# Patient Record
Sex: Female | Born: 1940 | Race: White | Hispanic: No | Marital: Married | Smoking: Never smoker
Health system: Southern US, Community
[De-identification: ages and names within clinical notes are randomized; demographics above are authoritative.]

## PROBLEM LIST (undated history)

## (undated) DIAGNOSIS — E119 Type 2 diabetes mellitus without complications: Secondary | ICD-10-CM

## (undated) DIAGNOSIS — Z923 Personal history of irradiation: Secondary | ICD-10-CM

## (undated) DIAGNOSIS — R002 Palpitations: Secondary | ICD-10-CM

## (undated) DIAGNOSIS — L97509 Non-pressure chronic ulcer of other part of unspecified foot with unspecified severity: Secondary | ICD-10-CM

## (undated) DIAGNOSIS — G500 Trigeminal neuralgia: Secondary | ICD-10-CM

## (undated) DIAGNOSIS — E041 Nontoxic single thyroid nodule: Secondary | ICD-10-CM

## (undated) DIAGNOSIS — E7800 Pure hypercholesterolemia, unspecified: Secondary | ICD-10-CM

## (undated) HISTORY — DX: Nontoxic single thyroid nodule: E04.1

## (undated) HISTORY — DX: Trigeminal neuralgia: G50.0

## (undated) HISTORY — DX: Palpitations: R00.2

## (undated) HISTORY — DX: Non-pressure chronic ulcer of other part of unspecified foot with unspecified severity: L97.509

---

## 1998-01-31 ENCOUNTER — Inpatient Hospital Stay (HOSPITAL_COMMUNITY): Admission: EM | Admit: 1998-01-31 | Discharge: 1998-02-01 | Payer: Self-pay | Admitting: Emergency Medicine

## 2001-04-07 ENCOUNTER — Other Ambulatory Visit: Admission: RE | Admit: 2001-04-07 | Discharge: 2001-04-07 | Payer: Self-pay | Admitting: Family Medicine

## 2004-04-14 ENCOUNTER — Other Ambulatory Visit: Admission: RE | Admit: 2004-04-14 | Discharge: 2004-04-14 | Payer: Self-pay | Admitting: Family Medicine

## 2005-05-03 ENCOUNTER — Other Ambulatory Visit: Admission: RE | Admit: 2005-05-03 | Discharge: 2005-05-03 | Payer: Self-pay | Admitting: Family Medicine

## 2005-09-26 ENCOUNTER — Encounter: Payer: Self-pay | Admitting: Emergency Medicine

## 2006-07-03 ENCOUNTER — Other Ambulatory Visit: Admission: RE | Admit: 2006-07-03 | Discharge: 2006-07-03 | Payer: Self-pay | Admitting: Family Medicine

## 2007-05-29 DIAGNOSIS — C50919 Malignant neoplasm of unspecified site of unspecified female breast: Secondary | ICD-10-CM

## 2007-05-29 HISTORY — PX: BREAST LUMPECTOMY: SHX2

## 2007-05-29 HISTORY — DX: Malignant neoplasm of unspecified site of unspecified female breast: C50.919

## 2007-11-18 ENCOUNTER — Other Ambulatory Visit: Admission: RE | Admit: 2007-11-18 | Discharge: 2007-11-18 | Payer: Self-pay | Admitting: Family Medicine

## 2008-12-13 ENCOUNTER — Other Ambulatory Visit: Admission: RE | Admit: 2008-12-13 | Discharge: 2008-12-13 | Payer: Self-pay | Admitting: Family Medicine

## 2010-02-06 ENCOUNTER — Other Ambulatory Visit: Admission: RE | Admit: 2010-02-06 | Discharge: 2010-02-06 | Payer: Self-pay | Admitting: Family Medicine

## 2011-02-19 ENCOUNTER — Other Ambulatory Visit: Payer: Self-pay | Admitting: Family Medicine

## 2011-02-19 ENCOUNTER — Other Ambulatory Visit (HOSPITAL_COMMUNITY)
Admission: RE | Admit: 2011-02-19 | Discharge: 2011-02-19 | Disposition: A | Discharge status: Home / Self Care | Payer: Medicare Other | Source: Ambulatory Visit | Attending: Family Medicine | Admitting: Family Medicine

## 2011-02-19 DIAGNOSIS — C50919 Malignant neoplasm of unspecified site of unspecified female breast: Secondary | ICD-10-CM | POA: Insufficient documentation

## 2012-06-02 ENCOUNTER — Other Ambulatory Visit (HOSPITAL_COMMUNITY)
Admission: RE | Admit: 2012-06-02 | Discharge: 2012-06-02 | Disposition: A | Discharge status: Home / Self Care | Payer: Medicare Other | Source: Ambulatory Visit | Attending: Family Medicine | Admitting: Family Medicine

## 2012-06-02 ENCOUNTER — Other Ambulatory Visit: Payer: Self-pay | Admitting: Family Medicine

## 2012-06-02 DIAGNOSIS — Z789 Other specified health status: Secondary | ICD-10-CM | POA: Insufficient documentation

## 2017-06-27 ENCOUNTER — Encounter: Payer: Self-pay | Admitting: Registered"

## 2017-06-27 ENCOUNTER — Encounter: Payer: Medicare Other | Attending: Family Medicine | Admitting: Registered"

## 2017-06-27 DIAGNOSIS — E119 Type 2 diabetes mellitus without complications: Secondary | ICD-10-CM

## 2017-06-27 DIAGNOSIS — E1142 Type 2 diabetes mellitus with diabetic polyneuropathy: Secondary | ICD-10-CM | POA: Diagnosis present

## 2017-06-27 NOTE — Patient Instructions (Signed)
You can include some carbohydrate with your breakfast, but consider having hot tea instead of juice. Think of rice and pasta as a side dish instead of a main entree and be sure to balance with protein. If your morning blood sugar is still high after a few weeks with making some changes, you can try a small bedtime snack. Continue with your regular exercise

## 2017-06-27 NOTE — Progress Notes (Signed)
Diabetes Self-Management Education  Visit Type: First/Initial  Appt. Start Time: 0905 Appt. End Time: 1010  06/27/2017  Ms. Alison Fields, identified by name and date of birth, is a 77 y.o. female with a diagnosis of Diabetes: Type 2.   ASSESSMENT Patient has diabetic peripheral neuropathy which was greatly affecting her sleep. Patient states since starting Lyrica she wakes up 2-3 times per night instead of 6-7 times and is able to go back to sleep.  Patient states both she and her husband have been prediabetic for a long time and since getting the diagnosis in October she has been doing more portion control to prevent weight gain and trying to limit bread to once a day. Patient states she has always been active and prefers to walk outside but has a treadmill in the house when there is inclement weather.  Patient states she works 2 days per week and enjoys staying busy with church activities, house and yard work, and helping out with grandchildren. Patient states her stress has been low since about 12/12/22 after her sister passed away. Prior to her passing, patient states she did have stress for many years trying to manage her sister's care while she was in a nursing home. Patient states they travel in their RV 2-3 months per year.  Diabetes Self-Management Education - 06/27/17 0913      Visit Information   Visit Type  First/Initial      Initial Visit   Diabetes Type  Type 2    Are you currently following a meal plan?  No    Are you taking your medications as prescribed?  Yes    Date Diagnosed  october 2018      Health Coping   How would you rate your overall health?  Good      Psychosocial Assessment   Patient Belief/Attitude about Diabetes  Motivated to manage diabetes    How often do you need to have someone help you when you read instructions, pamphlets, or other written materials from your doctor or pharmacy?  2 - Rarely    What is the last grade level you completed in school?  BA       Complications   Last HgB A1C per patient/outside source  6.4 % per patient, several FBG over 125 for diagnosis    How often do you check your blood sugar?  1-2 times/day    Fasting Blood glucose range (mg/dL)  70-129;130-179 a few times 130-135    Number of hypoglycemic episodes per month  0    Number of hyperglycemic episodes per week  0    Have you had a dilated eye exam in the past 12 months?  Yes    Have you had a dental exam in the past 12 months?  Yes    Are you checking your feet?  No      Dietary Intake   Breakfast  eggs, yogurt, 1/2 bread OR sausage with breakfast, tomato or OJ    Snack (morning)  none    Lunch  hummus graham crackers OR PB OR pimento cheese crackers, diet pepsi OR hamburger 1x week    Snack (afternoon)  none    Dinner  [place] OR pizza OR chili OR church on wednesday evening    Snack (evening)  2 piece of chocolate    Beverage(s)  water, juice, diet pepsi at lunch, 2 margaritas per month      Exercise   Exercise Type  Light (walking / raking  leaves)    How many days per week to you exercise?  5    How many minutes per day do you exercise?  45    Total minutes per week of exercise  225      Patient Education   Previous Diabetes Education  No    Disease state   Definition of diabetes, type 1 and 2, and the diagnosis of diabetes    Nutrition management   Role of diet in the treatment of diabetes and the relationship between the three main macronutrients and blood glucose level    Physical activity and exercise   Role of exercise on diabetes management, blood pressure control and cardiac health.    Monitoring  Identified appropriate SMBG and/or A1C goals.      Individualized Goals (developed by patient)   Nutrition  General guidelines for healthy choices and portions discussed    Physical Activity  Exercise 5-7 days per week      Outcomes   Expected Outcomes  Demonstrated interest in learning. Expect positive outcomes    Future DMSE  PRN    Program  Status  Completed     Individualized Plan for Diabetes Self-Management Training:   Learning Objective:  Patient will have a greater understanding of diabetes self-management. Patient education plan is to attend individual and/or group sessions per assessed needs and concerns.   Patient Instructions  You can include some carbohydrate with your breakfast, but consider having hot tea instead of juice. Think of rice and pasta as a side dish instead of a main entree and be sure to balance with protein. If your morning blood sugar is still high after a few weeks with making some changes, you can try a small bedtime snack. Continue with your regular exercise  Expected Outcomes:  Demonstrated interest in learning. Expect positive outcomes  Education material provided: Living Well with Diabetes, A1C conversion sheet and Snack sheet  If problems or questions, patient to contact team via:  Phone  Future DSME appointment: PRN

## 2018-02-02 ENCOUNTER — Observation Stay (HOSPITAL_COMMUNITY): Payer: Medicare Other

## 2018-02-02 ENCOUNTER — Other Ambulatory Visit: Payer: Self-pay

## 2018-02-02 ENCOUNTER — Observation Stay (HOSPITAL_COMMUNITY)
Admission: EM | Admit: 2018-02-02 | Discharge: 2018-02-03 | Disposition: A | Discharge status: Home / Self Care | Payer: Medicare Other | Attending: Internal Medicine | Admitting: Internal Medicine

## 2018-02-02 ENCOUNTER — Emergency Department (HOSPITAL_COMMUNITY): Payer: Medicare Other

## 2018-02-02 ENCOUNTER — Encounter (HOSPITAL_COMMUNITY): Payer: Self-pay | Admitting: Emergency Medicine

## 2018-02-02 DIAGNOSIS — Z888 Allergy status to other drugs, medicaments and biological substances status: Secondary | ICD-10-CM | POA: Insufficient documentation

## 2018-02-02 DIAGNOSIS — Z882 Allergy status to sulfonamides status: Secondary | ICD-10-CM | POA: Diagnosis not present

## 2018-02-02 DIAGNOSIS — G3189 Other specified degenerative diseases of nervous system: Secondary | ICD-10-CM | POA: Diagnosis not present

## 2018-02-02 DIAGNOSIS — E119 Type 2 diabetes mellitus without complications: Secondary | ICD-10-CM

## 2018-02-02 DIAGNOSIS — M47812 Spondylosis without myelopathy or radiculopathy, cervical region: Secondary | ICD-10-CM | POA: Diagnosis present

## 2018-02-02 DIAGNOSIS — E7800 Pure hypercholesterolemia, unspecified: Secondary | ICD-10-CM | POA: Insufficient documentation

## 2018-02-02 DIAGNOSIS — Z79899 Other long term (current) drug therapy: Secondary | ICD-10-CM | POA: Diagnosis not present

## 2018-02-02 DIAGNOSIS — E042 Nontoxic multinodular goiter: Secondary | ICD-10-CM | POA: Diagnosis not present

## 2018-02-02 DIAGNOSIS — M549 Dorsalgia, unspecified: Secondary | ICD-10-CM

## 2018-02-02 DIAGNOSIS — M50322 Other cervical disc degeneration at C5-C6 level: Secondary | ICD-10-CM | POA: Diagnosis not present

## 2018-02-02 DIAGNOSIS — Z880 Allergy status to penicillin: Secondary | ICD-10-CM | POA: Insufficient documentation

## 2018-02-02 DIAGNOSIS — R55 Syncope and collapse: Secondary | ICD-10-CM | POA: Diagnosis present

## 2018-02-02 DIAGNOSIS — E785 Hyperlipidemia, unspecified: Secondary | ICD-10-CM | POA: Diagnosis not present

## 2018-02-02 DIAGNOSIS — Z881 Allergy status to other antibiotic agents status: Secondary | ICD-10-CM | POA: Diagnosis not present

## 2018-02-02 DIAGNOSIS — M47819 Spondylosis without myelopathy or radiculopathy, site unspecified: Secondary | ICD-10-CM | POA: Diagnosis present

## 2018-02-02 HISTORY — DX: Type 2 diabetes mellitus without complications: E11.9

## 2018-02-02 HISTORY — DX: Pure hypercholesterolemia, unspecified: E78.00

## 2018-02-02 LAB — RAPID URINE DRUG SCREEN, HOSP PERFORMED
AMPHETAMINES: NOT DETECTED
BENZODIAZEPINES: NOT DETECTED
Barbiturates: NOT DETECTED
Cocaine: NOT DETECTED
Opiates: NOT DETECTED
Tetrahydrocannabinol: NOT DETECTED

## 2018-02-02 LAB — GLUCOSE, CAPILLARY
GLUCOSE-CAPILLARY: 119 mg/dL — AB (ref 70–99)
Glucose-Capillary: 86 mg/dL (ref 70–99)

## 2018-02-02 LAB — BASIC METABOLIC PANEL
ANION GAP: 8 (ref 5–15)
BUN: 14 mg/dL (ref 8–23)
CALCIUM: 9.2 mg/dL (ref 8.9–10.3)
CO2: 26 mmol/L (ref 22–32)
CREATININE: 0.72 mg/dL (ref 0.44–1.00)
Chloride: 106 mmol/L (ref 98–111)
Glucose, Bld: 103 mg/dL — ABNORMAL HIGH (ref 70–99)
Potassium: 4.2 mmol/L (ref 3.5–5.1)
SODIUM: 140 mmol/L (ref 135–145)

## 2018-02-02 LAB — CBC
HCT: 43.5 % (ref 36.0–46.0)
HCT: 43.9 % (ref 36.0–46.0)
HEMOGLOBIN: 13.8 g/dL (ref 12.0–15.0)
Hemoglobin: 14.1 g/dL (ref 12.0–15.0)
MCH: 30.3 pg (ref 26.0–34.0)
MCH: 30.1 pg (ref 26.0–34.0)
MCHC: 31.7 g/dL (ref 30.0–36.0)
MCHC: 32.1 g/dL (ref 30.0–36.0)
MCV: 95.4 fL (ref 78.0–100.0)
MCV: 93.8 fL (ref 78.0–100.0)
Platelets: 270 10*3/uL (ref 150–400)
Platelets: 265 10*3/uL (ref 150–400)
RBC: 4.56 MIL/uL (ref 3.87–5.11)
RBC: 4.68 MIL/uL (ref 3.87–5.11)
RDW: 13.4 % (ref 11.5–15.5)
RDW: 13.4 % (ref 11.5–15.5)
WBC: 7.9 10*3/uL (ref 4.0–10.5)
WBC: 8.1 10*3/uL (ref 4.0–10.5)

## 2018-02-02 LAB — URINALYSIS, ROUTINE W REFLEX MICROSCOPIC
Bilirubin Urine: NEGATIVE
Glucose, UA: NEGATIVE mg/dL
Hgb urine dipstick: NEGATIVE
Ketones, ur: NEGATIVE mg/dL
Leukocytes, UA: NEGATIVE
NITRITE: NEGATIVE
Protein, ur: NEGATIVE mg/dL
SPECIFIC GRAVITY, URINE: 1.019 (ref 1.005–1.030)
pH: 6 (ref 5.0–8.0)

## 2018-02-02 LAB — CREATININE, SERUM
CREATININE: 0.67 mg/dL (ref 0.44–1.00)
GFR calc Af Amer: >60 mL/min (ref >60)
GFR calc non Af Amer: >60 mL/min (ref >60)

## 2018-02-02 LAB — CBG MONITORING, ED: GLUCOSE-CAPILLARY: 95 mg/dL (ref 70–99)

## 2018-02-02 LAB — TROPONIN I: Troponin I: <0.03 ng/mL (ref <0.03)

## 2018-02-02 LAB — HEMOGLOBIN A1C
HEMOGLOBIN A1C: 6.3 % — AB (ref 4.8–5.6)
Mean Plasma Glucose: 134.11 mg/dL

## 2018-02-02 LAB — TSH: TSH: 0.947 u[IU]/mL (ref 0.350–4.500)

## 2018-02-02 MED ORDER — METFORMIN HCL ER 500 MG PO TB24
500.0000 mg | ORAL_TABLET | Freq: Every day | ORAL | Status: DC
  Administered 2018-02-03: 500 mg via ORAL
  Filled 2018-02-02: qty 1

## 2018-02-02 MED ORDER — ALUM & MAG HYDROXIDE-SIMETH 200-200-20 MG/5ML PO SUSP: 30.0000 mL | Freq: Four times a day (QID) | ORAL | Status: DC | PRN

## 2018-02-02 MED ORDER — BISACODYL 5 MG PO TBEC: 5.0000 mg | DELAYED_RELEASE_TABLET | Freq: Every day | ORAL | Status: DC | PRN

## 2018-02-02 MED ORDER — ENOXAPARIN SODIUM 40 MG/0.4ML ~~LOC~~ SOLN
40.0000 mg | SUBCUTANEOUS | Status: DC
  Filled 2018-02-02: qty 0.4

## 2018-02-02 MED ORDER — HYDROCODONE-ACETAMINOPHEN 5-325 MG PO TABS: 1.0000 | ORAL_TABLET | ORAL | Status: DC | PRN

## 2018-02-02 MED ORDER — TRAZODONE HCL 50 MG PO TABS
25.0000 mg | ORAL_TABLET | Freq: Every evening | ORAL | Status: DC | PRN
  Administered 2018-02-02: 25 mg via ORAL
  Filled 2018-02-02: qty 1

## 2018-02-02 MED ORDER — OCUVITE-LUTEIN PO CAPS: 1.0000 | ORAL_CAPSULE | Freq: Every day | ORAL | Status: DC

## 2018-02-02 MED ORDER — PROSIGHT PO TABS
1.0000 | ORAL_TABLET | Freq: Every day | ORAL | Status: DC
  Administered 2018-02-02 – 2018-02-03 (×2): 1 via ORAL
  Filled 2018-02-02 (×2): qty 1

## 2018-02-02 MED ORDER — PREGABALIN 25 MG PO CAPS
50.0000 mg | ORAL_CAPSULE | Freq: Three times a day (TID) | ORAL | Status: DC
  Administered 2018-02-02 – 2018-02-03 (×2): 50 mg via ORAL
  Filled 2018-02-02 (×3): qty 2

## 2018-02-02 MED ORDER — ONDANSETRON HCL 4 MG PO TABS: 4.0000 mg | ORAL_TABLET | Freq: Four times a day (QID) | ORAL | Status: DC | PRN

## 2018-02-02 MED ORDER — ATORVASTATIN CALCIUM 20 MG PO TABS
10.0000 mg | ORAL_TABLET | Freq: Every day | ORAL | Status: DC
  Administered 2018-02-02 – 2018-02-03 (×2): 10 mg via ORAL
  Filled 2018-02-02 (×2): qty 1

## 2018-02-02 MED ORDER — OMEGA-3-ACID ETHYL ESTERS 1 G PO CAPS
1.0000 g | ORAL_CAPSULE | Freq: Every day | ORAL | Status: DC
  Administered 2018-02-03: 1 g via ORAL
  Filled 2018-02-02 (×2): qty 1

## 2018-02-02 MED ORDER — ONDANSETRON HCL 4 MG/2ML IJ SOLN: 4.0000 mg | Freq: Four times a day (QID) | INTRAMUSCULAR | Status: DC | PRN

## 2018-02-02 MED ORDER — MULTIVITAMINS PO CAPS: 1.0000 | ORAL_CAPSULE | Freq: Every day | ORAL | Status: DC

## 2018-02-02 MED ORDER — ADULT MULTIVITAMIN W/MINERALS CH
1.0000 | ORAL_TABLET | Freq: Every day | ORAL | Status: DC
  Administered 2018-02-02 – 2018-02-03 (×2): 1 via ORAL
  Filled 2018-02-02 (×2): qty 1

## 2018-02-02 MED ORDER — INSULIN ASPART 100 UNIT/ML ~~LOC~~ SOLN: 0.0000 [IU] | Freq: Every day | SUBCUTANEOUS | Status: DC

## 2018-02-02 MED ORDER — INSULIN ASPART 100 UNIT/ML ~~LOC~~ SOLN: 0.0000 [IU] | Freq: Three times a day (TID) | SUBCUTANEOUS | Status: DC

## 2018-02-02 MED ORDER — CIPROFLOXACIN HCL 500 MG PO TABS
250.0000 mg | ORAL_TABLET | Freq: Two times a day (BID) | ORAL | Status: DC
  Administered 2018-02-02 – 2018-02-03 (×2): 250 mg via ORAL
  Filled 2018-02-02 (×2): qty 1

## 2018-02-02 MED ORDER — SODIUM CHLORIDE 0.9% FLUSH
3.0000 mL | Freq: Two times a day (BID) | INTRAVENOUS | Status: DC
  Administered 2018-02-02 – 2018-02-03 (×2): 3 mL via INTRAVENOUS

## 2018-02-02 NOTE — ED Notes (Signed)
Report called to rn on 5 m 

## 2018-02-02 NOTE — ED Notes (Signed)
Called patient to move to room. Unable to locate patient at this time. 

## 2018-02-02 NOTE — H&P (Signed)
History and Physical    Alison Fields ZDG:[id number] DOB: May 08, 1941 DOA: 02/02/2018  Referring MD/NP/PA:  PCP: Harlan Stains, MD  Outpatient Specialists:  Patient coming from: home  Chief Complaint: dizziness and back pain  HPI: Alison Fields is a 77 y.o. female with medical history significant diabetes and hyperlipidemia presenting with left spell Acute severe upper back and neck pain followed by dizziness with presyncopal episode. She was seen at the urgent care, where she was noted to be " clammy and pale" according to the patient,for which reason she was transferred to [facility] for further evaluation and management.patient's pain is about resolved at this moment he denies any dizziness, chills shortness of breath or palpitations.    ED Course:at the ED patient was hemodynamically stable with negative twelve-lead EKG for acute abnormality. Head CT was negative. Hospitalist consulted for admission for observation  Review of Systems: As per HPI otherwise 10 point review of systems negative.    Past Medical History:  Diagnosis Date  . Diabetes mellitus without complication ([place])   . High cholesterol     History reviewed. No pertinent surgical history.   reports that she has never smoked. She has never used smokeless tobacco. She reports that she drinks alcohol. She reports that she has current or past drug history.  Allergies  Allergen Reactions  . Bactrim [Sulfamethoxazole-Trimethoprim] Rash  . Camphor Hives  . Erythromycin Nausea Only  . Penicillins     No family history on file.  Prior to Admission medications   Medication Sig Start Date End Date Taking? Authorizing Provider  atorvastatin (LIPITOR) 10 MG tablet Take 10 mg by mouth daily.   Yes [provider]  ciprofloxacin (CIPRO) 250 MG tablet Take 250 mg by mouth 2 (two) times daily. Duration 7 days Started 01-28-18 01/29/18  Yes [provider]  Cranberry 405 MG CAPS Take by mouth.   Yes  [provider]  fexofenadine (ALLEGRA) 180 MG tablet Take 180 mg by mouth daily.   Yes [provider]  metFORMIN (GLUCOPHAGE-XR) 500 MG 24 hr tablet Take 500 mg by mouth daily with breakfast.   Yes [provider]  metroNIDAZOLE (METROCREAM) 0.75 % cream Apply topically 2 (two) times daily.   Yes [provider]  Multiple Vitamin (MULTIVITAMINS PO) Take by mouth.   Yes [provider]  multivitamin-lutein (OCUVITE-LUTEIN) CAPS capsule Take 1 capsule by mouth daily.   Yes [provider]  Omega-3 Fatty Acids (FISH OIL) 1000 MG CAPS Take by mouth 2 (two) times daily.   Yes [provider]  pregabalin (LYRICA) 50 MG capsule Take 50 mg by mouth 3 (three) times daily.   Yes [provider]    Physical Exam: Vitals:   02/02/18 1337 02/02/18 1430 02/02/18 1500 02/02/18 1530  BP:  135/73 134/81 120/72  Pulse:  76  70  Resp:  13  13  Temp:      TempSrc:      SpO2:  100%  97%  Weight: 69.4 kg     Height: 5' 6.5" (1.689 m)         Constitutional: NAD, calm, comfortable Vitals:   02/02/18 1337 02/02/18 1430 02/02/18 1500 02/02/18 1530  BP:  135/73 134/81 120/72  Pulse:  76  70  Resp:  13  13  Temp:      TempSrc:      SpO2:  100%  97%  Weight: 69.4 kg     Height: 5' 6.5" (1.689 m)  Eyes: PERRL, lids and conjunctivae normal ENMT: Mucous membranes are moist. Posterior pharynx clear of any exudate or lesions.Normal dentition.  Neck: normal, supple, no masses, no thyromegaly Respiratory: clear to auscultation bilaterally, no wheezing, no crackles. Normal respiratory effort. No accessory muscle use.  Cardiovascular: Regular rate and rhythm, no murmurs / rubs / gallops. No extremity edema. 2+ pedal pulses. No carotid bruits.  Abdomen: no tenderness, no masses palpated. No hepatosplenomegaly. Bowel sounds positive.  Musculoskeletal: no clubbing / cyanosis. No joint deformity upper and lower extremities. Good ROM, no  contractures. Normal muscle tone.  Skin: no rashes, lesions, ulcers. No induration Neurologic: CN 2-12 grossly intact. Sensation intact, DTR normal. Strength 5/5 in all 4.  Psychiatric: Normal judgment and insight. Alert and oriented x 3. Normal mood.    Labs on Admission: I have personally reviewed following labs and imaging studies  CBC: Recent Labs  Lab 02/02/18 1356  WBC 7.9  HGB 13.8  HCT 43.5  MCV 95.4  PLT 858   Basic Metabolic Panel: Recent Labs  Lab 02/02/18 1356  NA 140  K 4.2  CL 106  CO2 26  GLUCOSE 103*  BUN 14  CREATININE 0.72  CALCIUM 9.2   GFR: Estimated Creatinine Clearance: 56.2 mL/min (by C-G formula based on SCr of 0.72 mg/dL). Liver Function Tests: No results for input(s): AST, ALT, ALKPHOS, BILITOT, PROT, ALBUMIN in the last 168 hours. No results for input(s): LIPASE, AMYLASE in the last 168 hours. No results for input(s): AMMONIA in the last 168 hours. Coagulation Profile: No results for input(s): INR, PROTIME in the last 168 hours. Cardiac Enzymes: No results for input(s): CKTOTAL, CKMB, CKMBINDEX, TROPONINI in the last 168 hours. BNP (last 3 results) No results for input(s): PROBNP in the last 8760 hours. HbA1C: No results for input(s): HGBA1C in the last 72 hours. CBG: Recent Labs  Lab 02/02/18 1428  GLUCAP 95   Lipid Profile: No results for input(s): CHOL, HDL, LDLCALC, TRIG, CHOLHDL, LDLDIRECT in the last 72 hours. Thyroid Function Tests: No results for input(s): TSH, T4TOTAL, FREET4, T3FREE, THYROIDAB in the last 72 hours. Anemia Panel: No results for input(s): VITAMINB12, FOLATE, FERRITIN, TIBC, IRON, RETICCTPCT in the last 72 hours. Urine analysis:    Component Value Date/Time   COLORURINE YELLOW 02/02/2018 [place] 02/02/2018 1344   LABSPEC 1.019 02/02/2018 1344   PHURINE 6.0 02/02/2018 1344   GLUCOSEU NEGATIVE 02/02/2018 1344   HGBUR NEGATIVE 02/02/2018 1344   BILIRUBINUR NEGATIVE 02/02/2018 1344    KETONESUR NEGATIVE 02/02/2018 1344   PROTEINUR NEGATIVE 02/02/2018 1344   NITRITE NEGATIVE 02/02/2018 1344   LEUKOCYTESUR NEGATIVE 02/02/2018 1344   Sepsis Labs: @LABRCNTIP (procalcitonin:4,lacticidven:4) )No results found for this or any previous visit (from the past 240 hour(s)).   Radiological Exams on Admission: Ct Head Wo Contrast  Result Date: 02/02/2018 CLINICAL DATA:  Lightheadedness and left arm shaking this morning. EXAM: CT HEAD WITHOUT CONTRAST TECHNIQUE: Contiguous axial images were obtained from the base of the skull through the vertex without intravenous contrast. COMPARISON:  None. FINDINGS: Brain: Minimal enlargement of the ventricles and subarachnoid spaces. No intracranial hemorrhage, mass lesion or CT evidence of acute infarction. Vascular: No hyperdense vessel or unexpected calcification. Skull: Mild bilateral hyperostosis frontalis. Sinuses/Orbits: Status post bilateral cataract extraction. Unremarkable paranasal sinuses. Other: None. IMPRESSION: 1. No acute abnormality. 2. Minimal, age-appropriate diffuse cerebral and cerebellar atrophy. Electronically Signed   By: Claudie Revering M.D.   On: 02/02/2018 15:12    EKG: Independently reviewed.  Assessment/Plan Active Problems:   Near syncope   1. Near syncope: Likely vasovagal reaction to acute spell of upper back and neck pain Telemetry monitoring  2-D echocardiogram (carotid Doppler ordered) Check troponin  2. Acute back/neck pain: Controlled Check x-rays of C-spine and thoracic spine When necessary I dissected  3. Diabetes mellitus:  A1c Sliding-scale insulin Glycemic control      DVT prophylaxis: Lovenox Code Status: Full Family Communication: patient and husband at bedside  Disposition Plan: Anticipate discharge home tomorrow if all is well Consults called:  Admission status:  obs / tele    Benito Mccreedy MD [facility] Pager [phone]  If 7PM-7AM, please contact  night-coverage [email or web address] Password [email or web address]  02/02/2018, 4:30 PM

## 2018-02-02 NOTE — ED Notes (Signed)
Pt has been  Taken to xray unable to transport pt to floor

## 2018-02-02 NOTE — ED Notes (Signed)
Called for pt in lobby, no answer, spouse states she is in restroom

## 2018-02-02 NOTE — Progress Notes (Signed)
Pt arrived from the ED on stretcher. Pt denies any pain, dizziness or light headedness. Pt is alert and oriented x4. She wears glasses and was oriented to the room & staff. Husband at the bedside. 20 G IV in the right AC SL. Pt fall risk- yellow sock and yellow armband placed on, pt educated to call before getting up. Call bell and phone within reach. Will  Continue to monitor.   Paulla Fore, RN .

## 2018-02-02 NOTE — ED Notes (Signed)
ekg ordered again   One was  Done at 1344

## 2018-02-02 NOTE — ED Provider Notes (Signed)
[facility] EMERGENCY DEPARTMENT Provider Note   CSN: [id number] Arrival date & time: 02/02/18  1328     History   Chief Complaint Chief Complaint  Patient presents with  . Dizziness  . Back Pain    HPI Alison Fields is a 77 y.o. female.  HPI Presents after an episode of lightheadedness/near syncope/back pain. Patient was in her usual state of health, when she felt sudden onset near syncope. She was standing up, and at Sunday school, when this occurred, there is both lightheadedness, dizziness, as well as pain in the upper thorax posteriorly. Symptoms have gradually improved spontaneously, though she still does not feel entirely back to normal. She notes that she is a history of diabetes, hypercholesterolemia, but states that she is generally well, active, without recent health issues.  Past Medical History:  Diagnosis Date  . Diabetes mellitus without complication ([place])   . High cholesterol     Patient Active Problem List   Diagnosis Date Noted  . Newly diagnosed diabetes ([place]) 06/27/2017    History reviewed. No pertinent surgical history.   OB History   None      Home Medications    Prior to Admission medications   Medication Sig Start Date End Date Taking? Authorizing Provider  atorvastatin (LIPITOR) 10 MG tablet Take 10 mg by mouth daily.   Yes [provider]  ciprofloxacin (CIPRO) 250 MG tablet Take 250 mg by mouth 2 (two) times daily. Duration 7 days Started 01-28-18 01/29/18  Yes [provider]  Cranberry 405 MG CAPS Take by mouth.   Yes [provider]  fexofenadine (ALLEGRA) 180 MG tablet Take 180 mg by mouth daily.   Yes [provider]  metFORMIN (GLUCOPHAGE-XR) 500 MG 24 hr tablet Take 500 mg by mouth daily with breakfast.   Yes [provider]  metroNIDAZOLE (METROCREAM) 0.75 % cream Apply topically 2 (two) times daily.   Yes [provider]  Multiple Vitamin (MULTIVITAMINS PO)  Take by mouth.   Yes [provider]  multivitamin-lutein (OCUVITE-LUTEIN) CAPS capsule Take 1 capsule by mouth daily.   Yes [provider]  Omega-3 Fatty Acids (FISH OIL) 1000 MG CAPS Take by mouth 2 (two) times daily.   Yes [provider]  pregabalin (LYRICA) 50 MG capsule Take 50 mg by mouth 3 (three) times daily.   Yes [provider]    Family History No family history on file.  Social History Social History   Tobacco Use  . Smoking status: Never Smoker  . Smokeless tobacco: Never Used  Substance Use Topics  . Alcohol use: Yes  . Drug use: Not Currently     Allergies   Bactrim [sulfamethoxazole-trimethoprim]; Camphor; Erythromycin; and Penicillins   Review of Systems Review of Systems  Constitutional:       Per HPI, otherwise negative  HENT:       Per HPI, otherwise negative  Respiratory:       Per HPI, otherwise negative  Cardiovascular:       Per HPI, otherwise negative  Gastrointestinal: Negative for vomiting.  Endocrine:       Negative aside from HPI  Genitourinary:       Neg aside from HPI   Musculoskeletal:       Per HPI, otherwise negative  Skin: Negative.   Neurological: Positive for light-headedness. Negative for syncope.     Physical Exam Updated Vital Signs BP 135/73   Pulse 76   Temp 98.2 F (36.8  C) (Oral)   Resp 13   Ht 5' 6.5" (1.689 m)   Wt 69.4 kg   SpO2 100%   BMI 24.32 kg/m   Physical Exam  Constitutional: She is oriented to person, place, and time. She appears well-developed and well-nourished. No distress.  HENT:  Head: Normocephalic and atraumatic.  Eyes: Conjunctivae and EOM are normal.  Cardiovascular: Normal rate, regular rhythm and intact distal pulses.  Pulmonary/Chest: Effort normal and breath sounds normal. No stridor. No respiratory distress.  Abdominal: She exhibits no distension.  Musculoskeletal: She exhibits no edema.  Neurological: She is alert and oriented to person,  place, and time. No cranial nerve deficit.  Skin: Skin is warm and dry.  Psychiatric: She has a normal mood and affect.  Nursing note and vitals reviewed.    ED Treatments / Results  Labs (all labs ordered are listed, but only abnormal results are displayed) Labs Reviewed  BASIC METABOLIC PANEL - Abnormal; Notable for the following components:      Result Value   Glucose, Bld 103 (*)    All other components within normal limits  CBC  URINALYSIS, ROUTINE W REFLEX MICROSCOPIC  RAPID URINE DRUG SCREEN, HOSP PERFORMED  CBG MONITORING, ED    EKG EKG Interpretation  Date/Time:  Sunday February 02 2018 13:37:30 EDT Ventricular Rate:  81 PR Interval:  134 QRS Duration: 84 QT Interval:  386 QTC Calculation: 448 R Axis:   79 Text Interpretation:  Normal sinus rhythm Nonspecific ST and T wave abnormality Abnormal ECG Abnormal ekg Confirmed by Carmin Muskrat [phone]) on 02/02/2018 2:22:08 PM   Radiology Ct Head Wo Contrast  Result Date: 02/02/2018 CLINICAL DATA:  Lightheadedness and left arm shaking this morning. EXAM: CT HEAD WITHOUT CONTRAST TECHNIQUE: Contiguous axial images were obtained from the base of the skull through the vertex without intravenous contrast. COMPARISON:  None. FINDINGS: Brain: Minimal enlargement of the ventricles and subarachnoid spaces. No intracranial hemorrhage, mass lesion or CT evidence of acute infarction. Vascular: No hyperdense vessel or unexpected calcification. Skull: Mild bilateral hyperostosis frontalis. Sinuses/Orbits: Status post bilateral cataract extraction. Unremarkable paranasal sinuses. Other: None. IMPRESSION: 1. No acute abnormality. 2. Minimal, age-appropriate diffuse cerebral and cerebellar atrophy. Electronically Signed   By: Claudie Revering M.D.   On: 02/02/2018 15:12    Procedures Procedures (including critical care time)  Medications Ordered in ED Medications - No data to display   Initial Impression / Assessment and Plan / ED Course    I have reviewed the triage vital signs and the nursing notes.  Pertinent labs & imaging results that were available during my care of the patient were reviewed by me and considered in my medical decision making (see chart for details).  Initial evaluation I reviewed the patient's documentation from her outpatient visit today, reassuring labs. EKG similar to the one performed here.     3:53 PM Repeat exam the patient is awake and alert.  Not she remains hemodynamic Lee unremarkable. We discussed all findings including unclear etiology for her episode of near syncope, dizziness. Patient has appropriate pulses bilaterally, there is low suspicion for dissection, particularly given the patient's blood pressure value.  CT unremarkable, and given the absence of persistent neurologic deficits, low suspicion for CNS pathology.  With some suspicion for arrhythmia, patient has been on continuous monitoring since ED arrival, will be admitted for further monitoring, management.   Final Clinical Impressions(s) / ED Diagnoses  Near syncope   Carmin Muskrat, MD 02/02/18 1554

## 2018-02-02 NOTE — ED Notes (Signed)
No pain

## 2018-02-02 NOTE — ED Notes (Signed)
CBG obtained (95 mg/dL)

## 2018-02-02 NOTE — ED Triage Notes (Signed)
Pt. Stated, I just left [facility] . I was sitting in Sunday [employer] and started having some pain across my shoulders in the back. I then become light headed and my left arm shaking. My pain in the back is gone its just sore. My head just feels foggy.

## 2018-02-03 ENCOUNTER — Observation Stay (HOSPITAL_BASED_OUTPATIENT_CLINIC_OR_DEPARTMENT_OTHER): Payer: Medicare Other

## 2018-02-03 ENCOUNTER — Encounter (HOSPITAL_COMMUNITY): Payer: Self-pay | Admitting: Internal Medicine

## 2018-02-03 DIAGNOSIS — R55 Syncope and collapse: Secondary | ICD-10-CM

## 2018-02-03 DIAGNOSIS — I5030 Unspecified diastolic (congestive) heart failure: Secondary | ICD-10-CM | POA: Diagnosis not present

## 2018-02-03 LAB — GLUCOSE, CAPILLARY
Glucose-Capillary: 101 mg/dL — ABNORMAL HIGH (ref 70–99)
Glucose-Capillary: 122 mg/dL — ABNORMAL HIGH (ref 70–99)
Glucose-Capillary: 98 mg/dL (ref 70–99)

## 2018-02-03 LAB — ECHOCARDIOGRAM COMPLETE
HEIGHTINCHES: 66.5 in
Weight: 2444.46 oz

## 2018-02-03 LAB — TROPONIN I

## 2018-02-03 MED ORDER — CIPROFLOXACIN HCL 500 MG PO TABS: 250.0000 mg | ORAL_TABLET | Freq: Two times a day (BID) | ORAL | Status: DC

## 2018-02-03 NOTE — Discharge Summary (Signed)
Physician Discharge Summary  Alison Fields ERX:[id number] DOB: [birth year] DOA: 02/02/2018  PCP: Harlan Stains, MD  Admit date: 02/02/2018 Discharge date: 02/04/2018  Admitted From: Home Disposition: Home  Recommendations for Outpatient Follow-up:  1. Follow up with PCP in 3 or 4 days 2. Please obtain thyroid ultrasound due to diagnosis of thyroid nodules on carotid ultrasound.   Home Health: No Equipment/Devices: None  Discharge Condition: Stable CODE STATUS: Full code Diet recommendation: Heart healthy carbohydrate modified  Brief/Interim Summary: Alison Fields is a 77 y.o. female with medical history significant diabetes and hyperlipidemia presenting with left spell Acute severe upper back and neck pain followed by dizziness with presyncopal episode. She was seen at the urgent care, where she was noted to be " clammy and pale" according to the patient,for which reason she was transferred to [facility] for further evaluation and management.patient's pain is about resolved at this moment he denies any dizziness, chills shortness of breath or palpitations.  Patient appears to have a vasovagal reaction due to an acute episode of upper back and neck pain.  2D echocardiogram was obtained and showed grade 1 diastolic dysfunction.  Carotid Doppler was unremarkable in terms of vascular imaging except for 1 to 39% bilateral plaque.  Patient was noted to have multiple thyroid nodules.  She ruled out for myocardial infarction with serial troponins. Cervical spine films revealed C5-6 cervical spondylosis.  Lumbar spine films showed multilevel degeneration, as did thoracic spine with swimmer's views. Patient will follow-up with Dr. Dema Severin for ultrasound of the thyroid.  She might benefit from some physical therapy for spinal arthritis or start with Tylenol.  I did encourage her given the low level of carotid disease to start taking aspirin 1 mg a day but to discuss it with Dr. Dema Severin.  Did have extensive  discussion with the patient regarding her diabetes and an anti-inflammatory diet for diabetes control.  Patient has reached maximal benefit of hospitalization.  Discharge diagnosis, prognosis, plans, follow-up, medications and treatments discussed with the patient(or responsible party) and is in agreement with the plans as described.  Patient is stable for discharge.  Discharge Diagnoses:  Principal Problem:   Near syncope Active Problems:   Newly diagnosed diabetes ([place])   Multiple thyroid nodules   Degenerative arthritis of cervical spine   Spinal arthritis    Discharge Instructions  Discharge Instructions    Diet - low sodium heart healthy   Complete by:  As directed    Diet Carb Modified   Complete by:  As directed    Discharge instructions   Complete by:  As directed    Follow-up with Dr. Dema Severin in 3 or 4 days if symptoms persist recommend further evaluation with outpatient testing.   Increase activity slowly   Complete by:  As directed      Allergies as of 02/03/2018      Reactions   Bactrim [sulfamethoxazole-trimethoprim] Rash   Camphor Hives   Erythromycin Nausea Only   Penicillins       Medication List    TAKE these medications   atorvastatin 10 MG tablet Commonly known as:  LIPITOR Take 10 mg by mouth daily.   ciprofloxacin 250 MG tablet Commonly known as:  CIPRO Take 250 mg by mouth 2 (two) times daily. Duration 7 days Started 01-28-18   Cranberry 405 MG Caps Take by mouth.   fexofenadine 180 MG tablet Commonly known as:  ALLEGRA Take 180 mg by mouth daily.   Fish Oil 1000 MG Caps Take  by mouth 2 (two) times daily.   metFORMIN 500 MG 24 hr tablet Commonly known as:  GLUCOPHAGE-XR Take 500 mg by mouth daily with breakfast.   metroNIDAZOLE 0.75 % cream Commonly known as:  METROCREAM Apply topically 2 (two) times daily.   multivitamin-lutein Caps capsule Take 1 capsule by mouth daily.   MULTIVITAMINS PO Take by mouth.   pregabalin 50 MG  capsule Commonly known as:  LYRICA Take 50 mg by mouth 3 (three) times daily.      Follow-up Information    [facility] .   Specialty:  Vascular Surgery Contact information: [address] [id number] [place] [address] [phone]         Allergies  Allergen Reactions  . Bactrim [Sulfamethoxazole-Trimethoprim] Rash  . Camphor Hives  . Erythromycin Nausea Only  . Penicillins        Procedures/Studies: Dg Cervical Spine 2-3 Views  Result Date: 02/02/2018 CLINICAL DATA:  Acute neck, mid back and LOWER back pain today. Initial encounter. EXAM: CERVICAL SPINE - 2-3 VIEW COMPARISON:  None. FINDINGS: No acute fracture, subluxation or prevertebral soft tissue swelling. Moderate degenerative disc disease/spondylosis at C5-6 noted. Moderate multilevel facet arthropathy identified. No suspicious focal bony lesions identified. IMPRESSION: 1. No evidence of acute abnormality 2. Moderate degenerative disc disease/spondylosis at C5-6 and moderate multilevel facet arthropathy. Electronically Signed   By: Margarette Canada M.D.   On: 02/02/2018 17:12   Dg Thoracic Spine W/swimmers  Result Date: 02/02/2018 CLINICAL DATA:  Acute mid back pain today.  Initial encounter. EXAM: THORACIC SPINE - 3 VIEWS COMPARISON:  None. FINDINGS: No acute fracture or subluxation. No focal bony lesions are identified. Mild multilevel degenerative disc disease/spondylosis noted. IMPRESSION: 1. No acute abnormality 2. Mild multilevel degenerative changes. Electronically Signed   By: Margarette Canada M.D.   On: 02/02/2018 17:15   Dg Lumbar Spine Complete  Result Date: 02/02/2018 CLINICAL DATA:  Acute low back pain today.  Initial encounter. EXAM: LUMBAR SPINE - COMPLETE 4+ VIEW COMPARISON:  None. FINDINGS: Five non rib-bearing lumbar type vertebra are identified in normal alignment. Mild-to-moderate multilevel degenerative disc disease/spondylosis and facet arthropathy  noted, greatest from L3-S1. No focal bony lesions or spondylolysis noted. IMPRESSION: No acute abnormality. Multilevel degenerative changes, greatest from L3-S1. Electronically Signed   By: Margarette Canada M.D.   On: 02/02/2018 17:14   Ct Head Wo Contrast  Result Date: 02/02/2018 CLINICAL DATA:  Lightheadedness and left arm shaking this morning. EXAM: CT HEAD WITHOUT CONTRAST TECHNIQUE: Contiguous axial images were obtained from the base of the skull through the vertex without intravenous contrast. COMPARISON:  None. FINDINGS: Brain: Minimal enlargement of the ventricles and subarachnoid spaces. No intracranial hemorrhage, mass lesion or CT evidence of acute infarction. Vascular: No hyperdense vessel or unexpected calcification. Skull: Mild bilateral hyperostosis frontalis. Sinuses/Orbits: Status post bilateral cataract extraction. Unremarkable paranasal sinuses. Other: None. IMPRESSION: 1. No acute abnormality. 2. Minimal, age-appropriate diffuse cerebral and cerebellar atrophy. Electronically Signed   By: Claudie Revering M.D.   On: 02/02/2018 15:12   Bilateral carotid Dopplers: Multiple thyroid nodules seen at thyroid gland.  Final Interpretation: Right Carotid: Velocities in the right ICA are consistent with a 1-39% stenosis.  Left Carotid: Velocities in the left ICA are consistent with a 1-39% stenosis.  Vertebrals: Bilateral vertebral arteries demonstrate antegrade flow.   Subjective:  Patient feeling better.  No further episodes of pain.   Discharge Exam: Vitals:   02/03/18 0750 02/03/18 1643  BP:  131/63 109/71  Pulse: (!) 59 75  Resp: 18 18  Temp: 98 F (36.7 C) 98.4 F (36.9 C)  SpO2: 96% 96%   Vitals:   02/02/18 2103 02/03/18 0504 02/03/18 0750 02/03/18 1643  BP: 127/79 123/66 131/63 109/71  Pulse: 75 67 (!) 59 75  Resp: 18 18 18 18   Temp: 98.5 F (36.9 C) (!) 97.5 F (36.4 C) 98 F (36.7 C) 98.4 F (36.9 C)  TempSrc: Oral Oral Oral Oral  SpO2: 97% 95% 96% 96%  Weight:  69.3 kg     Height:        General: Pt is alert, awake, not in acute distress Cardiovascular: RRR, S1/S2 +, no rubs, no gallops Respiratory: CTA bilaterally, no wheezing, no rhonchi Abdominal: Soft, NT, ND, bowel sounds + Extremities: no edema, no cyanosis    The results of significant diagnostics from this hospitalization (including imaging, microbiology, ancillary and laboratory) are listed below for reference.     Microbiology: No results found for this or any previous visit (from the past 240 hour(s)).   Labs: BNP (last 3 results) No results for input(s): BNP in the last 8760 hours. Basic Metabolic Panel: Recent Labs  Lab 02/02/18 1356 02/02/18 1721  NA 140  --   K 4.2  --   CL 106  --   CO2 26  --   GLUCOSE 103*  --   BUN 14  --   CREATININE 0.72 0.67  CALCIUM 9.2  --    CBC: Recent Labs  Lab 02/02/18 1356 02/02/18 1721  WBC 7.9 8.1  HGB 13.8 14.1  HCT 43.5 43.9  MCV 95.4 93.8  PLT 270 265   Cardiac Enzymes: Recent Labs  Lab 02/02/18 1721 02/02/18 2300 02/03/18 0720  TROPONINI <0.03 <0.03 <0.03   BNP: Invalid input(s): POCBNP CBG: Recent Labs  Lab 02/02/18 1754 02/02/18 2104 02/03/18 0752 02/03/18 1155 02/03/18 1644  GLUCAP 86 119* 101* 122* 98   D-Dimer No results for input(s): DDIMER in the last 72 hours. Hgb A1c Recent Labs    02/02/18 1721  HGBA1C 6.3*   Lipid Profile No results for input(s): CHOL, HDL, LDLCALC, TRIG, CHOLHDL, LDLDIRECT in the last 72 hours. Thyroid function studies Recent Labs    02/02/18 1721  TSH 0.947   Anemia work up No results for input(s): VITAMINB12, FOLATE, FERRITIN, TIBC, IRON, RETICCTPCT in the last 72 hours. Urinalysis    Component Value Date/Time   COLORURINE YELLOW 02/02/2018 1344   APPEARANCEUR CLEAR 02/02/2018 1344   LABSPEC 1.019 02/02/2018 1344   PHURINE 6.0 02/02/2018 1344   GLUCOSEU NEGATIVE 02/02/2018 1344   HGBUR NEGATIVE 02/02/2018 1344   BILIRUBINUR NEGATIVE 02/02/2018 1344    KETONESUR NEGATIVE 02/02/2018 1344   PROTEINUR NEGATIVE 02/02/2018 1344   NITRITE NEGATIVE 02/02/2018 1344   LEUKOCYTESUR NEGATIVE 02/02/2018 1344   Sepsis Labs Invalid input(s): PROCALCITONIN,  WBC,  LACTICIDVEN Microbiology No results found for this or any previous visit (from the past 240 hour(s)).   Time coordinating discharge: 42 minutes  SIGNED:   Lady Deutscher, MD  FACP [facility] 02/04/2018, 10:27 AM Pager   If 7PM-7AM, please contact night-coverage [email or web address] Password [email or web address]

## 2018-02-03 NOTE — Care Management Obs Status (Signed)
[facility] NOTIFICATION   Patient Details  Name: Lavaya Defreitas MRN: [id number] Date of Birth: [birth year]   Medicare Observation Status Notification Given:  Yes    Bethena Roys, RN 02/03/2018, 4:51 PM

## 2018-02-03 NOTE — Progress Notes (Signed)
Preliminary notes--Bilateral carotid duplex exam completed. Bilateral ICAs 1-39% stenosis. Bilateral vertebral arteries antegrade flow. Incidental finding:  Multiple thyroid nodules seen at bilateral thyroid glands, further evaluation may needed if clinical considers necessary. Alison Fields (RDMS RVT) 02/03/18 3:53 PM

## 2018-02-03 NOTE — Progress Notes (Signed)
  Echocardiogram 2D Echocardiogram has been performed.  Alison Fields 02/03/2018, 9:52 AM

## 2018-02-04 DIAGNOSIS — E042 Nontoxic multinodular goiter: Secondary | ICD-10-CM | POA: Diagnosis present

## 2018-02-04 DIAGNOSIS — M47819 Spondylosis without myelopathy or radiculopathy, site unspecified: Secondary | ICD-10-CM | POA: Diagnosis present

## 2018-02-04 DIAGNOSIS — M47812 Spondylosis without myelopathy or radiculopathy, cervical region: Secondary | ICD-10-CM | POA: Diagnosis present

## 2018-02-07 ENCOUNTER — Other Ambulatory Visit: Payer: Self-pay | Admitting: Family Medicine

## 2018-02-07 DIAGNOSIS — E042 Nontoxic multinodular goiter: Secondary | ICD-10-CM

## 2018-02-13 ENCOUNTER — Ambulatory Visit
Admission: RE | Admit: 2018-02-13 | Discharge: 2018-02-13 | Disposition: A | Discharge status: Home / Self Care | Payer: Medicare Other | Source: Ambulatory Visit | Attending: Family Medicine | Admitting: Family Medicine

## 2018-02-13 DIAGNOSIS — E042 Nontoxic multinodular goiter: Secondary | ICD-10-CM

## 2018-02-25 ENCOUNTER — Other Ambulatory Visit: Payer: Self-pay | Admitting: Family Medicine

## 2018-02-25 DIAGNOSIS — E041 Nontoxic single thyroid nodule: Secondary | ICD-10-CM

## 2018-04-09 ENCOUNTER — Ambulatory Visit
Admission: RE | Admit: 2018-04-09 | Discharge: 2018-04-09 | Disposition: A | Discharge status: Home / Self Care | Payer: Medicare Other | Source: Ambulatory Visit | Attending: Family Medicine | Admitting: Family Medicine

## 2018-04-09 ENCOUNTER — Other Ambulatory Visit (HOSPITAL_COMMUNITY)
Admission: RE | Admit: 2018-04-09 | Discharge: 2018-04-09 | Disposition: A | Discharge status: Home / Self Care | Payer: Medicare Other | Source: Ambulatory Visit | Attending: Radiology | Admitting: Radiology

## 2018-04-09 DIAGNOSIS — E041 Nontoxic single thyroid nodule: Secondary | ICD-10-CM

## 2019-04-01 ENCOUNTER — Other Ambulatory Visit: Payer: Self-pay | Admitting: Family Medicine

## 2019-04-01 DIAGNOSIS — Z1231 Encounter for screening mammogram for malignant neoplasm of breast: Secondary | ICD-10-CM

## 2019-04-27 ENCOUNTER — Ambulatory Visit
Admission: RE | Admit: 2019-04-27 | Discharge: 2019-04-27 | Disposition: A | Discharge status: Home / Self Care | Payer: Medicare Other | Source: Ambulatory Visit | Attending: Family Medicine | Admitting: Family Medicine

## 2019-04-27 ENCOUNTER — Other Ambulatory Visit: Payer: Self-pay | Admitting: Family Medicine

## 2019-04-27 DIAGNOSIS — E041 Nontoxic single thyroid nodule: Secondary | ICD-10-CM

## 2019-05-25 ENCOUNTER — Other Ambulatory Visit: Payer: Self-pay

## 2019-05-25 ENCOUNTER — Ambulatory Visit
Admission: RE | Admit: 2019-05-25 | Discharge: 2019-05-25 | Disposition: A | Discharge status: Home / Self Care | Payer: Medicare Other | Source: Ambulatory Visit | Attending: Family Medicine | Admitting: Family Medicine

## 2019-05-25 DIAGNOSIS — Z1231 Encounter for screening mammogram for malignant neoplasm of breast: Secondary | ICD-10-CM

## 2019-05-25 HISTORY — DX: Personal history of irradiation: Z92.3

## 2019-07-05 ENCOUNTER — Ambulatory Visit: Payer: Medicare Other

## 2019-07-21 ENCOUNTER — Ambulatory Visit: Payer: Medicare Other

## 2020-03-24 ENCOUNTER — Other Ambulatory Visit: Payer: Self-pay | Admitting: Family Medicine

## 2020-03-24 DIAGNOSIS — Z1231 Encounter for screening mammogram for malignant neoplasm of breast: Secondary | ICD-10-CM

## 2020-05-25 ENCOUNTER — Ambulatory Visit
Admission: RE | Admit: 2020-05-25 | Discharge: 2020-05-25 | Disposition: A | Discharge status: Home / Self Care | Payer: Medicare Other | Source: Ambulatory Visit | Attending: Family Medicine | Admitting: Family Medicine

## 2020-05-25 ENCOUNTER — Other Ambulatory Visit: Payer: Self-pay

## 2020-05-25 DIAGNOSIS — Z1231 Encounter for screening mammogram for malignant neoplasm of breast: Secondary | ICD-10-CM

## 2020-05-31 DIAGNOSIS — E1142 Type 2 diabetes mellitus with diabetic polyneuropathy: Secondary | ICD-10-CM | POA: Diagnosis not present

## 2020-05-31 DIAGNOSIS — K219 Gastro-esophageal reflux disease without esophagitis: Secondary | ICD-10-CM | POA: Diagnosis not present

## 2020-05-31 DIAGNOSIS — Z853 Personal history of malignant neoplasm of breast: Secondary | ICD-10-CM | POA: Diagnosis not present

## 2020-06-03 DIAGNOSIS — L84 Corns and callosities: Secondary | ICD-10-CM | POA: Diagnosis not present

## 2020-06-03 DIAGNOSIS — L603 Nail dystrophy: Secondary | ICD-10-CM | POA: Diagnosis not present

## 2020-06-03 DIAGNOSIS — E1142 Type 2 diabetes mellitus with diabetic polyneuropathy: Secondary | ICD-10-CM | POA: Diagnosis not present

## 2020-06-04 DIAGNOSIS — J22 Unspecified acute lower respiratory infection: Secondary | ICD-10-CM | POA: Diagnosis not present

## 2020-06-14 DIAGNOSIS — H9201 Otalgia, right ear: Secondary | ICD-10-CM | POA: Diagnosis not present

## 2020-07-27 DIAGNOSIS — K219 Gastro-esophageal reflux disease without esophagitis: Secondary | ICD-10-CM | POA: Diagnosis not present

## 2020-07-27 DIAGNOSIS — E1142 Type 2 diabetes mellitus with diabetic polyneuropathy: Secondary | ICD-10-CM | POA: Diagnosis not present

## 2020-07-27 DIAGNOSIS — Z853 Personal history of malignant neoplasm of breast: Secondary | ICD-10-CM | POA: Diagnosis not present

## 2020-09-02 DIAGNOSIS — E1142 Type 2 diabetes mellitus with diabetic polyneuropathy: Secondary | ICD-10-CM | POA: Diagnosis not present

## 2020-09-02 DIAGNOSIS — L603 Nail dystrophy: Secondary | ICD-10-CM | POA: Diagnosis not present

## 2020-09-02 DIAGNOSIS — L84 Corns and callosities: Secondary | ICD-10-CM | POA: Diagnosis not present

## 2020-09-14 DIAGNOSIS — K219 Gastro-esophageal reflux disease without esophagitis: Secondary | ICD-10-CM | POA: Diagnosis not present

## 2020-09-14 DIAGNOSIS — Z853 Personal history of malignant neoplasm of breast: Secondary | ICD-10-CM | POA: Diagnosis not present

## 2020-09-14 DIAGNOSIS — E1142 Type 2 diabetes mellitus with diabetic polyneuropathy: Secondary | ICD-10-CM | POA: Diagnosis not present

## 2020-09-26 DIAGNOSIS — E1142 Type 2 diabetes mellitus with diabetic polyneuropathy: Secondary | ICD-10-CM | POA: Diagnosis not present

## 2020-09-26 DIAGNOSIS — Z1159 Encounter for screening for other viral diseases: Secondary | ICD-10-CM | POA: Diagnosis not present

## 2020-09-26 DIAGNOSIS — Z0000 Encounter for general adult medical examination without abnormal findings: Secondary | ICD-10-CM | POA: Diagnosis not present

## 2020-09-26 DIAGNOSIS — Z7984 Long term (current) use of oral hypoglycemic drugs: Secondary | ICD-10-CM | POA: Diagnosis not present

## 2020-09-26 DIAGNOSIS — Z853 Personal history of malignant neoplasm of breast: Secondary | ICD-10-CM | POA: Diagnosis not present

## 2020-09-26 DIAGNOSIS — R7309 Other abnormal glucose: Secondary | ICD-10-CM | POA: Diagnosis not present

## 2020-09-26 DIAGNOSIS — G500 Trigeminal neuralgia: Secondary | ICD-10-CM | POA: Diagnosis not present

## 2020-09-26 DIAGNOSIS — Z136 Encounter for screening for cardiovascular disorders: Secondary | ICD-10-CM | POA: Diagnosis not present

## 2020-09-26 DIAGNOSIS — E041 Nontoxic single thyroid nodule: Secondary | ICD-10-CM | POA: Diagnosis not present

## 2020-09-27 ENCOUNTER — Other Ambulatory Visit: Payer: Self-pay | Admitting: Family Medicine

## 2020-09-27 DIAGNOSIS — E041 Nontoxic single thyroid nodule: Secondary | ICD-10-CM

## 2020-10-04 ENCOUNTER — Ambulatory Visit
Admission: RE | Admit: 2020-10-04 | Discharge: 2020-10-04 | Disposition: A | Discharge status: Home / Self Care | Payer: Medicare Other | Source: Ambulatory Visit | Attending: Family Medicine | Admitting: Family Medicine

## 2020-10-04 DIAGNOSIS — E041 Nontoxic single thyroid nodule: Secondary | ICD-10-CM | POA: Diagnosis not present

## 2020-12-06 DIAGNOSIS — L84 Corns and callosities: Secondary | ICD-10-CM | POA: Diagnosis not present

## 2020-12-06 DIAGNOSIS — E1142 Type 2 diabetes mellitus with diabetic polyneuropathy: Secondary | ICD-10-CM | POA: Diagnosis not present

## 2020-12-06 DIAGNOSIS — L603 Nail dystrophy: Secondary | ICD-10-CM | POA: Diagnosis not present

## 2020-12-06 DIAGNOSIS — H524 Presbyopia: Secondary | ICD-10-CM | POA: Diagnosis not present

## 2020-12-06 DIAGNOSIS — E119 Type 2 diabetes mellitus without complications: Secondary | ICD-10-CM | POA: Diagnosis not present

## 2020-12-06 DIAGNOSIS — M76812 Anterior tibial syndrome, left leg: Secondary | ICD-10-CM | POA: Diagnosis not present

## 2020-12-13 DIAGNOSIS — M76812 Anterior tibial syndrome, left leg: Secondary | ICD-10-CM | POA: Diagnosis not present

## 2020-12-15 DIAGNOSIS — L821 Other seborrheic keratosis: Secondary | ICD-10-CM | POA: Diagnosis not present

## 2020-12-22 DIAGNOSIS — E1142 Type 2 diabetes mellitus with diabetic polyneuropathy: Secondary | ICD-10-CM | POA: Diagnosis not present

## 2020-12-22 DIAGNOSIS — K219 Gastro-esophageal reflux disease without esophagitis: Secondary | ICD-10-CM | POA: Diagnosis not present

## 2020-12-22 DIAGNOSIS — Z853 Personal history of malignant neoplasm of breast: Secondary | ICD-10-CM | POA: Diagnosis not present

## 2021-03-07 DIAGNOSIS — I70203 Unspecified atherosclerosis of native arteries of extremities, bilateral legs: Secondary | ICD-10-CM | POA: Diagnosis not present

## 2021-03-07 DIAGNOSIS — L84 Corns and callosities: Secondary | ICD-10-CM | POA: Diagnosis not present

## 2021-03-07 DIAGNOSIS — E1151 Type 2 diabetes mellitus with diabetic peripheral angiopathy without gangrene: Secondary | ICD-10-CM | POA: Diagnosis not present

## 2021-03-07 DIAGNOSIS — I739 Peripheral vascular disease, unspecified: Secondary | ICD-10-CM | POA: Diagnosis not present

## 2021-03-07 DIAGNOSIS — L603 Nail dystrophy: Secondary | ICD-10-CM | POA: Diagnosis not present

## 2021-03-09 DIAGNOSIS — E1142 Type 2 diabetes mellitus with diabetic polyneuropathy: Secondary | ICD-10-CM | POA: Diagnosis not present

## 2021-03-09 DIAGNOSIS — Z853 Personal history of malignant neoplasm of breast: Secondary | ICD-10-CM | POA: Diagnosis not present

## 2021-03-09 DIAGNOSIS — K219 Gastro-esophageal reflux disease without esophagitis: Secondary | ICD-10-CM | POA: Diagnosis not present

## 2021-03-28 DIAGNOSIS — U071 COVID-19: Secondary | ICD-10-CM | POA: Diagnosis not present

## 2021-04-06 DIAGNOSIS — U071 COVID-19: Secondary | ICD-10-CM | POA: Diagnosis not present

## 2021-04-14 ENCOUNTER — Other Ambulatory Visit: Payer: Self-pay | Admitting: Family Medicine

## 2021-04-14 DIAGNOSIS — Z1231 Encounter for screening mammogram for malignant neoplasm of breast: Secondary | ICD-10-CM

## 2021-04-18 DIAGNOSIS — E1151 Type 2 diabetes mellitus with diabetic peripheral angiopathy without gangrene: Secondary | ICD-10-CM | POA: Diagnosis not present

## 2021-05-24 DIAGNOSIS — G500 Trigeminal neuralgia: Secondary | ICD-10-CM | POA: Diagnosis not present

## 2021-05-24 DIAGNOSIS — E1142 Type 2 diabetes mellitus with diabetic polyneuropathy: Secondary | ICD-10-CM | POA: Diagnosis not present

## 2021-05-26 ENCOUNTER — Ambulatory Visit
Admission: RE | Admit: 2021-05-26 | Discharge: 2021-05-26 | Disposition: A | Discharge status: Home / Self Care | Payer: Medicare Other | Source: Ambulatory Visit | Attending: Family Medicine | Admitting: Family Medicine

## 2021-05-26 DIAGNOSIS — Z1231 Encounter for screening mammogram for malignant neoplasm of breast: Secondary | ICD-10-CM

## 2021-06-05 DIAGNOSIS — K219 Gastro-esophageal reflux disease without esophagitis: Secondary | ICD-10-CM | POA: Diagnosis not present

## 2021-06-05 DIAGNOSIS — E1142 Type 2 diabetes mellitus with diabetic polyneuropathy: Secondary | ICD-10-CM | POA: Diagnosis not present

## 2021-10-02 ENCOUNTER — Emergency Department (HOSPITAL_BASED_OUTPATIENT_CLINIC_OR_DEPARTMENT_OTHER)
Admission: EM | Admit: 2021-10-02 | Discharge: 2021-10-02 | Disposition: A | Discharge status: Home / Self Care | Payer: Medicare Other | Attending: Emergency Medicine | Admitting: Emergency Medicine

## 2021-10-02 ENCOUNTER — Other Ambulatory Visit: Payer: Self-pay

## 2021-10-02 ENCOUNTER — Emergency Department (HOSPITAL_BASED_OUTPATIENT_CLINIC_OR_DEPARTMENT_OTHER): Payer: Medicare Other

## 2021-10-02 ENCOUNTER — Encounter (HOSPITAL_BASED_OUTPATIENT_CLINIC_OR_DEPARTMENT_OTHER): Payer: Self-pay | Admitting: Obstetrics and Gynecology

## 2021-10-02 DIAGNOSIS — M542 Cervicalgia: Secondary | ICD-10-CM | POA: Insufficient documentation

## 2021-10-02 DIAGNOSIS — Z79899 Other long term (current) drug therapy: Secondary | ICD-10-CM | POA: Diagnosis not present

## 2021-10-02 DIAGNOSIS — R531 Weakness: Secondary | ICD-10-CM | POA: Diagnosis not present

## 2021-10-02 DIAGNOSIS — R519 Headache, unspecified: Secondary | ICD-10-CM | POA: Insufficient documentation

## 2021-10-02 DIAGNOSIS — E119 Type 2 diabetes mellitus without complications: Secondary | ICD-10-CM | POA: Diagnosis not present

## 2021-10-02 DIAGNOSIS — Z7984 Long term (current) use of oral hypoglycemic drugs: Secondary | ICD-10-CM | POA: Diagnosis not present

## 2021-10-02 DIAGNOSIS — Z853 Personal history of malignant neoplasm of breast: Secondary | ICD-10-CM | POA: Insufficient documentation

## 2021-10-02 DIAGNOSIS — G4486 Cervicogenic headache: Secondary | ICD-10-CM | POA: Diagnosis not present

## 2021-10-02 LAB — BASIC METABOLIC PANEL
Anion gap: 11 (ref 5–15)
BUN: 18 mg/dL (ref 8–23)
CO2: 27 mmol/L (ref 22–32)
Calcium: 10.5 mg/dL — ABNORMAL HIGH (ref 8.9–10.3)
Chloride: 99 mmol/L (ref 98–111)
Creatinine, Ser: 0.62 mg/dL (ref 0.44–1.00)
GFR, Estimated: >60 mL/min (ref >60)
Glucose, Bld: 102 mg/dL — ABNORMAL HIGH (ref 70–99)
Potassium: 4 mmol/L (ref 3.5–5.1)
Sodium: 137 mmol/L (ref 135–145)

## 2021-10-02 LAB — CBC WITH DIFFERENTIAL/PLATELET
Abs Immature Granulocytes: 0.01 10*3/uL (ref 0.00–0.07)
Basophils Absolute: 0.1 10*3/uL (ref 0.0–0.1)
Basophils Relative: 1 %
Eosinophils Absolute: 0 10*3/uL (ref 0.0–0.5)
Eosinophils Relative: 0 %
HCT: 44 % (ref 36.0–46.0)
Hemoglobin: 14.2 g/dL (ref 12.0–15.0)
Immature Granulocytes: 0 %
Lymphocytes Relative: 21 %
Lymphs Abs: 1.5 10*3/uL (ref 0.7–4.0)
MCH: 29.8 pg (ref 26.0–34.0)
MCHC: 32.3 g/dL (ref 30.0–36.0)
MCV: 92.2 fL (ref 80.0–100.0)
Monocytes Absolute: 0.7 10*3/uL (ref 0.1–1.0)
Monocytes Relative: 10 %
Neutro Abs: 4.7 10*3/uL (ref 1.7–7.7)
Neutrophils Relative %: 68 %
Platelets: 283 10*3/uL (ref 150–400)
RBC: 4.77 MIL/uL (ref 3.87–5.11)
RDW: 13.2 % (ref 11.5–15.5)
WBC: 6.9 10*3/uL (ref 4.0–10.5)
nRBC: 0 % (ref 0.0–0.2)

## 2021-10-02 MED ORDER — METHOCARBAMOL 500 MG PO TABS: 500.0000 mg | ORAL_TABLET | Freq: Two times a day (BID) | ORAL | 0 refills | Status: DC

## 2021-10-02 MED ORDER — IOHEXOL 350 MG/ML SOLN
75.0000 mL | Freq: Once | INTRAVENOUS | Status: AC | PRN
  Administered 2021-10-02: 100 mL via INTRAVENOUS

## 2021-10-02 NOTE — ED Notes (Signed)
RN provided AVS using Teachback Method. Patient verbalizes understanding of Discharge Instructions. Opportunity for Questioning and Answers were provided by RN. Patient Discharged from ED ambulatory to Home with Family. ? ?

## 2021-10-02 NOTE — Discharge Instructions (Signed)
Contact a health care provider if: ?You have headaches that are getting worse and happening more often. ?You have headaches with any of the following: ?Fever. ?Numbness. ?Weakness. ?Dizziness. ?Nausea or vomiting. ?

## 2021-10-02 NOTE — ED Notes (Signed)
Patient transported to radiology at this Time. ?

## 2021-10-02 NOTE — ED Notes (Signed)
PA at the Bedside. ?

## 2021-10-02 NOTE — ED Provider Notes (Signed)
[facility] DE[facility] Provider Note   CSN: [id number] Arrival date & time: 10/02/21  1536     History  Chief Complaint  Patient presents with   Headache    Alison Fields is a 81 y.o. female who presents emergency department with chief complaint of throbbing in her occipital region.  Patient is noted to have a past medical history of degenerative arthritis of the cervical spine, high cholesterol, history of breast cancer with radiation therapy, history of thyroid nodules and diabetes.  Patient is normally very active walking 2 miles a day with her husband.  Today she states that around 4:00 in the morning she was woke from sleep with extremely painful throbbing sensation over the lateral right occipital region.  She states that since that time she has felt a little off balance and has been feeling really rundown today.  She states is very unusual for her.  She went to an urgent care but was sent here for further evaluation.  She denies changes in vision, she has no current pain at this time she denies nausea, vomiting, room spinning dizziness, unilateral weakness, changes in her hearing.  The history is provided by the patient. No language interpreter was used.  Headache     Home Medications Prior to Admission medications   Medication Sig Start Date End Date Taking? Authorizing Provider  atorvastatin (LIPITOR) 10 MG tablet Take 10 mg by mouth daily.    [provider]  ciprofloxacin (CIPRO) 250 MG tablet Take 250 mg by mouth 2 (two) times daily. Duration 7 days Started 01-28-18 01/29/18   [provider]  Cranberry 405 MG CAPS Take by mouth.    [provider]  fexofenadine (ALLEGRA) 180 MG tablet Take 180 mg by mouth daily.    [provider]  metFORMIN (GLUCOPHAGE-XR) 500 MG 24 hr tablet Take 500 mg by mouth daily with breakfast.    [provider]  metroNIDAZOLE (METROCREAM) 0.75 % cream Apply topically 2 (two) times daily.     [provider]  Multiple Vitamin (MULTIVITAMINS PO) Take by mouth.    [provider]  multivitamin-lutein (OCUVITE-LUTEIN) CAPS capsule Take 1 capsule by mouth daily.    [provider]  Omega-3 Fatty Acids (FISH OIL) 1000 MG CAPS Take by mouth 2 (two) times daily.    [provider]  pregabalin (LYRICA) 50 MG capsule Take 50 mg by mouth 3 (three) times daily.    [provider]      Allergies    Bactrim [sulfamethoxazole-trimethoprim], Camphor, Erythromycin, and Penicillins    Review of Systems   Review of Systems  Neurological:  Positive for headaches.   Physical Exam Updated Vital Signs BP (!) 151/82 (BP Location: Right Arm)   Pulse 85   Temp 98.8 F (37.1 C)   Resp 14   Ht 5' 6.5" (1.689 m)   Wt 68.9 kg   SpO2 100%   BMI 24.15 kg/m  Physical Exam Vitals and nursing note reviewed.  Constitutional:      General: She is not in acute distress.    Appearance: She is well-developed. She is not diaphoretic.  HENT:     Head: Normocephalic and atraumatic.     Right Ear: External ear normal.     Left Ear: External ear normal.     Nose: Nose normal.     Mouth/Throat:     Mouth: Mucous membranes are moist.  Eyes:     General: No visual field deficit or  scleral icterus.    Extraocular Movements: Extraocular movements intact.     Conjunctiva/sclera: Conjunctivae normal.     Pupils: Pupils are equal, round, and reactive to light.  Cardiovascular:     Rate and Rhythm: Normal rate and regular rhythm.     Heart sounds: Normal heart sounds. No murmur heard.   No friction rub. No gallop.  Pulmonary:     Effort: Pulmonary effort is normal. No respiratory distress.     Breath sounds: Normal breath sounds.  Abdominal:     General: Bowel sounds are normal. There is no distension.     Palpations: Abdomen is soft. There is no mass.     Tenderness: There is no abdominal tenderness. There is no guarding.  Musculoskeletal:     Cervical  back: Normal range of motion.  Skin:    General: Skin is warm and dry.  Neurological:     Mental Status: She is alert and oriented to person, place, and time.     GCS: GCS eye subscore is 4. GCS verbal subscore is 5. GCS motor subscore is 6.     Cranial Nerves: No cranial nerve deficit, dysarthria or facial asymmetry.     Sensory: No sensory deficit.     Motor: No weakness.     Coordination: Romberg sign negative. Coordination normal.     Gait: Gait normal.     Deep Tendon Reflexes: Reflexes normal.     Comments: Patient had some difficulty with tandem walking.  The remainder of her balance and coordination testing was normal.  No nystagmus  Psychiatric:        Behavior: Behavior normal.    ED Results / Procedures / Treatments   Labs (all labs ordered are listed, but only abnormal results are displayed) Labs Reviewed  BASIC METABOLIC PANEL  CBC WITH DIFFERENTIAL/PLATELET    EKG None  Radiology No results found.  Procedures Procedures    Medications Ordered in ED Medications - No data to display  ED Course/ Medical Decision Making/ A&P Clinical Course as of 10/02/21 1953  Mon Oct 02, 2021  1607 Basic metabolic panel(!) [AH]  3710 CBC with Differential [AH]  1953 CT ANGIO HEAD NECK W WO CM I visualized CT angiogram of the head and neck which shows no acute findings.  Agree with radiologic interpretation [AH]    Clinical Course User Index [AH] Margarita Mail, PA-C                           Medical Decision Making 81 year old female who presents emergency department with a chief complaint of neck pain and occipital pain on the right along with some balance issues this morning presents for evaluation.  Her symptoms have resolved prior to arrival but she is feeling more weak and more drained.  Differential diagnosis includes occipital neuralgia, cervicogenic headache, muscle spasm, vertebral artery dissection.  Her neurologic examination was otherwise reassuring.  CT  angiogram of the head and neck showed no vaso-occlusive disease or dissections.  Patient will be discharged with Robaxin and Tylenol should this recur.  She is to follow-up with her PCP this coming Friday in 1 week.  Discussed outpatient follow-up and return precautions.  Problems Addressed: Occipital pain: acute illness or injury  Amount and/or Complexity of Data Reviewed Independent Historian: spouse Labs: ordered. Decision-making details documented in ED Course. Radiology: ordered and independent interpretation performed. Decision-making details documented in ED Course.  Risk Prescription drug management.  Final Clinical Impression(s) / ED Diagnoses Final diagnoses:  None    Rx / DC Orders ED Discharge Orders     None         Margarita Mail, PA-C 10/02/21 1953    Luna Fuse, MD 10/13/21 2236

## 2021-10-02 NOTE — ED Triage Notes (Signed)
Patient reports to the ER for a throbbing pain in the occipital area of her head. Patient reports it woke her from sleep. Patient reports she felt unsteady and could not seem to get the pain under control. She reports she was able to finally go back to sleep. Patient reports the back of her head is sensitive and she feels weaker than normal.  ?

## 2021-10-06 DIAGNOSIS — Z79899 Other long term (current) drug therapy: Secondary | ICD-10-CM | POA: Diagnosis not present

## 2021-10-06 DIAGNOSIS — Z853 Personal history of malignant neoplasm of breast: Secondary | ICD-10-CM | POA: Diagnosis not present

## 2021-10-06 DIAGNOSIS — K219 Gastro-esophageal reflux disease without esophagitis: Secondary | ICD-10-CM | POA: Diagnosis not present

## 2021-10-06 DIAGNOSIS — Z0000 Encounter for general adult medical examination without abnormal findings: Secondary | ICD-10-CM | POA: Diagnosis not present

## 2021-10-06 DIAGNOSIS — R519 Headache, unspecified: Secondary | ICD-10-CM | POA: Diagnosis not present

## 2021-10-06 DIAGNOSIS — E1142 Type 2 diabetes mellitus with diabetic polyneuropathy: Secondary | ICD-10-CM | POA: Diagnosis not present

## 2021-10-06 DIAGNOSIS — G500 Trigeminal neuralgia: Secondary | ICD-10-CM | POA: Diagnosis not present

## 2021-10-06 DIAGNOSIS — E041 Nontoxic single thyroid nodule: Secondary | ICD-10-CM | POA: Diagnosis not present

## 2021-10-16 ENCOUNTER — Other Ambulatory Visit: Payer: Self-pay | Admitting: Family Medicine

## 2021-10-16 DIAGNOSIS — E2839 Other primary ovarian failure: Secondary | ICD-10-CM

## 2021-10-24 ENCOUNTER — Ambulatory Visit
Admission: RE | Admit: 2021-10-24 | Discharge: 2021-10-24 | Disposition: A | Discharge status: Home / Self Care | Payer: Medicare Other | Source: Ambulatory Visit | Attending: Family Medicine | Admitting: Family Medicine

## 2021-10-24 DIAGNOSIS — E2839 Other primary ovarian failure: Secondary | ICD-10-CM

## 2021-10-24 DIAGNOSIS — M85832 Other specified disorders of bone density and structure, left forearm: Secondary | ICD-10-CM | POA: Diagnosis not present

## 2021-10-24 DIAGNOSIS — Z780 Asymptomatic menopausal state: Secondary | ICD-10-CM | POA: Diagnosis not present

## 2021-12-05 DIAGNOSIS — K219 Gastro-esophageal reflux disease without esophagitis: Secondary | ICD-10-CM | POA: Diagnosis not present

## 2021-12-05 DIAGNOSIS — E1142 Type 2 diabetes mellitus with diabetic polyneuropathy: Secondary | ICD-10-CM | POA: Diagnosis not present

## 2021-12-05 DIAGNOSIS — E785 Hyperlipidemia, unspecified: Secondary | ICD-10-CM | POA: Diagnosis not present

## 2021-12-08 DIAGNOSIS — Z961 Presence of intraocular lens: Secondary | ICD-10-CM | POA: Diagnosis not present

## 2021-12-08 DIAGNOSIS — H5203 Hypermetropia, bilateral: Secondary | ICD-10-CM | POA: Diagnosis not present

## 2021-12-08 DIAGNOSIS — E119 Type 2 diabetes mellitus without complications: Secondary | ICD-10-CM | POA: Diagnosis not present

## 2022-03-08 DIAGNOSIS — H5711 Ocular pain, right eye: Secondary | ICD-10-CM | POA: Diagnosis not present

## 2022-03-08 DIAGNOSIS — H18831 Recurrent erosion of cornea, right eye: Secondary | ICD-10-CM | POA: Diagnosis not present

## 2022-03-09 DIAGNOSIS — S0502XD Injury of conjunctiva and corneal abrasion without foreign body, left eye, subsequent encounter: Secondary | ICD-10-CM | POA: Diagnosis not present

## 2022-03-09 DIAGNOSIS — H1820 Unspecified corneal edema: Secondary | ICD-10-CM | POA: Diagnosis not present

## 2022-04-01 DIAGNOSIS — G5139 Clonic hemifacial spasm, unspecified: Secondary | ICD-10-CM | POA: Diagnosis not present

## 2022-04-09 ENCOUNTER — Other Ambulatory Visit: Payer: Self-pay

## 2022-04-09 ENCOUNTER — Emergency Department (HOSPITAL_BASED_OUTPATIENT_CLINIC_OR_DEPARTMENT_OTHER)
Admission: EM | Admit: 2022-04-09 | Discharge: 2022-04-09 | Disposition: A | Discharge status: Home / Self Care | Payer: Medicare Other | Attending: Emergency Medicine | Admitting: Emergency Medicine

## 2022-04-09 ENCOUNTER — Emergency Department (HOSPITAL_BASED_OUTPATIENT_CLINIC_OR_DEPARTMENT_OTHER): Payer: Medicare Other | Admitting: Radiology

## 2022-04-09 ENCOUNTER — Encounter (HOSPITAL_BASED_OUTPATIENT_CLINIC_OR_DEPARTMENT_OTHER): Payer: Self-pay | Admitting: Emergency Medicine

## 2022-04-09 DIAGNOSIS — R000 Tachycardia, unspecified: Secondary | ICD-10-CM | POA: Diagnosis not present

## 2022-04-09 DIAGNOSIS — Z853 Personal history of malignant neoplasm of breast: Secondary | ICD-10-CM | POA: Diagnosis not present

## 2022-04-09 DIAGNOSIS — R079 Chest pain, unspecified: Secondary | ICD-10-CM | POA: Diagnosis not present

## 2022-04-09 DIAGNOSIS — E119 Type 2 diabetes mellitus without complications: Secondary | ICD-10-CM | POA: Insufficient documentation

## 2022-04-09 DIAGNOSIS — Z7984 Long term (current) use of oral hypoglycemic drugs: Secondary | ICD-10-CM | POA: Diagnosis not present

## 2022-04-09 DIAGNOSIS — X500XXA Overexertion from strenuous movement or load, initial encounter: Secondary | ICD-10-CM | POA: Insufficient documentation

## 2022-04-09 DIAGNOSIS — X509XXA Other and unspecified overexertion or strenuous movements or postures, initial encounter: Secondary | ICD-10-CM

## 2022-04-09 LAB — BASIC METABOLIC PANEL
Anion gap: 13 (ref 5–15)
BUN: 15 mg/dL (ref 8–23)
CO2: 24 mmol/L (ref 22–32)
Calcium: 10 mg/dL (ref 8.9–10.3)
Chloride: 100 mmol/L (ref 98–111)
Creatinine, Ser: 0.61 mg/dL (ref 0.44–1.00)
GFR, Estimated: >60 mL/min (ref >60)
Glucose, Bld: 115 mg/dL — ABNORMAL HIGH (ref 70–99)
Potassium: 4 mmol/L (ref 3.5–5.1)
Sodium: 137 mmol/L (ref 135–145)

## 2022-04-09 LAB — CBC
HCT: 43.2 % (ref 36.0–46.0)
Hemoglobin: 13.9 g/dL (ref 12.0–15.0)
MCH: 29.8 pg (ref 26.0–34.0)
MCHC: 32.2 g/dL (ref 30.0–36.0)
MCV: 92.7 fL (ref 80.0–100.0)
Platelets: 265 10*3/uL (ref 150–400)
RBC: 4.66 MIL/uL (ref 3.87–5.11)
RDW: 13.5 % (ref 11.5–15.5)
WBC: 6.8 10*3/uL (ref 4.0–10.5)
nRBC: 0 % (ref 0.0–0.2)

## 2022-04-09 LAB — TROPONIN I (HIGH SENSITIVITY)
Troponin I (High Sensitivity): 2 ng/L (ref <18)
Troponin I (High Sensitivity): 3 ng/L (ref <18)

## 2022-04-09 NOTE — ED Triage Notes (Signed)
Pt arrives to ED with c/o tachycardia. She reports she was doing housework today when she suddenly felt "off", "doesn't feel right", and her heart race. She notes that her pulse at home was in the 130's. Was dx w/ trigeminal neuralgia last week, was given lyrica for x5 days.

## 2022-04-09 NOTE — ED Provider Notes (Signed)
[facility]ERGENC[facility] DEPT Provider Note   CSN: [id number] Arrival date & time: 04/09/22  1352     History  Chief Complaint  Patient presents with   Tachycardia    Alison Fields is a 81 y.o. female with medical history of breast cancer, diabetes, high cholesterol.  Patient presents to ED for evaluation of tachycardia.  Patient reports that today she was doing house chores to include pushing and pulling boxes, cleaning for about an hour and a half.  Patient reports that towards the end of her chores, she noted that she felt "off".  Patient is unable to discern what this means, just describes that she felt "off" and weak.  Patient notes that at this time she put a pulse ox on and was noted to have a heart rate of 130 bpm.  Patient denies any chest pain or shortness of breath during this time.  The patient reports that at this time, she sat down and her and her husband decided to present to the ED for evaluation.  On examination, the patient denies any chest pain, shortness of breath, nausea, vomiting, diarrhea, abdominal pain, back pain, dysuria, lightheadedness, dizziness, syncopal events.  Patient denies history of cardiac events.  Patient reports that last week she was diagnosed with trigeminal neuralgia and placed on Lyrica for 5 days.  HPI     Home Medications Prior to Admission medications   Medication Sig Start Date End Date Taking? Authorizing Provider  atorvastatin (LIPITOR) 10 MG tablet Take 10 mg by mouth daily.    [provider]  ciprofloxacin (CIPRO) 250 MG tablet Take 250 mg by mouth 2 (two) times daily. Duration 7 days Started 01-28-18 01/29/18   [provider]  Cranberry 405 MG CAPS Take by mouth.    [provider]  fexofenadine (ALLEGRA) 180 MG tablet Take 180 mg by mouth daily.    [provider]  metFORMIN (GLUCOPHAGE-XR) 500 MG 24 hr tablet Take 500 mg by mouth daily with breakfast.    [provider]   methocarbamol (ROBAXIN) 500 MG tablet Take 1 tablet (500 mg total) by mouth 2 (two) times daily. 10/02/21   Harris, Abigail, PA-C  metroNIDAZOLE (METROCREAM) 0.75 % cream Apply topically 2 (two) times daily.    [provider]  Multiple Vitamin (MULTIVITAMINS PO) Take by mouth.    [provider]  multivitamin-lutein (OCUVITE-LUTEIN) CAPS capsule Take 1 capsule by mouth daily.    [provider]  Omega-3 Fatty Acids (FISH OIL) 1000 MG CAPS Take by mouth 2 (two) times daily.    [provider]  pregabalin (LYRICA) 50 MG capsule Take 50 mg by mouth 3 (three) times daily.    [provider]      Allergies    Bactrim [sulfamethoxazole-trimethoprim], Camphor, Ciprofloxacin, Erythromycin, and Penicillins    Review of Systems   Review of Systems  Respiratory:  Negative for shortness of breath.   Cardiovascular:  Negative for chest pain.  Gastrointestinal:  Negative for abdominal pain, nausea and vomiting.  Genitourinary:  Negative for dysuria.  Musculoskeletal:  Negative for back pain.  Neurological:  Positive for weakness. Negative for dizziness, syncope and light-headedness.  All other systems reviewed and are negative.   Physical Exam Updated Vital Signs BP 129/63   Pulse 66   Temp 97.6 F (36.4 C) (Temporal)   Resp 15   Ht '5\' 7"'$  (1.702 m)   Wt 68.9 kg   SpO2 97%   BMI 23.81 kg/m  Physical  Exam Vitals and nursing note reviewed.  Constitutional:      General: She is not in acute distress.    Appearance: Normal appearance. She is not ill-appearing, toxic-appearing or diaphoretic.  HENT:     Head: Normocephalic and atraumatic.     Nose: Nose normal. No congestion.     Mouth/Throat:     Mouth: Mucous membranes are moist.     Pharynx: Oropharynx is clear.  Eyes:     Extraocular Movements: Extraocular movements intact.     Conjunctiva/sclera: Conjunctivae normal.     Pupils: Pupils are equal, round, and reactive to light.   Cardiovascular:     Rate and Rhythm: Normal rate and regular rhythm.  Pulmonary:     Effort: Pulmonary effort is normal.     Breath sounds: Normal breath sounds. No wheezing.  Abdominal:     General: Abdomen is flat. Bowel sounds are normal.     Palpations: Abdomen is soft.     Tenderness: There is no abdominal tenderness.  Musculoskeletal:     Cervical back: Normal range of motion and neck supple. No tenderness.  Skin:    General: Skin is warm and dry.     Capillary Refill: Capillary refill takes less than 2 seconds.  Neurological:     General: No focal deficit present.     Mental Status: She is alert and oriented to person, place, and time.     GCS: GCS eye subscore is 4. GCS verbal subscore is 5. GCS motor subscore is 6.     Cranial Nerves: Cranial nerves 2-12 are intact. No cranial nerve deficit.     Sensory: Sensation is intact. No sensory deficit.     Motor: Motor function is intact. No weakness.     Coordination: Coordination is intact. Heel to [facility] Test normal.     Comments: Equal strength and sensation bilateral upper and lower extremities.  Rapid alternating motions intact.  Patient with intact finger-to-nose, heel-to-shin.  Cranial nerves II through XII intact.  No pronator drift.     ED Results / Procedures / Treatments   Labs (all labs ordered are listed, but only abnormal results are displayed) Labs Reviewed  BASIC METABOLIC PANEL - Abnormal; Notable for the following components:      Result Value   Glucose, Bld 115 (*)    All other components within normal limits  CBC  TROPONIN I (HIGH SENSITIVITY)  TROPONIN I (HIGH SENSITIVITY)    EKG EKG Interpretation  Date/Time:  Monday April 09 2022 14:02:17 EST Ventricular Rate:  91 PR Interval:  136 QRS Duration: 86 QT Interval:  362 QTC Calculation: 445 R Axis:   67 Text Interpretation: Normal sinus rhythm ST & T wave abnormality, consider inferolateral ischemia Abnormal ECG When compared with ECG of  02-Feb-2018 13:37, No significant change was found when comapared to prior, similar ST abnormalites in 2,3,AVL. some new ST abnormalities in  V4-V6. No STEMI Confirmed by Antony Blackbird [id number]) on 04/09/2022 2:05:47 PM  Radiology DG Chest 2 View  Result Date: 04/09/2022 CLINICAL DATA:  Chest pain.  Elevated heart rate. EXAM: CHEST - 2 VIEW COMPARISON:  Report of 02/02/2018. FINDINGS: Lateral view degraded by patient arm position. Midline trachea. Normal heart size. Atherosclerosis in the transverse aorta. No pleural effusion or pneumothorax. Clear lungs. Suspect an EKG lead projecting over the right upper lobe laterally on the frontal film. IMPRESSION: No acute cardiopulmonary disease. Aortic Atherosclerosis (ICD10-I70.0). Probable EKG lead projecting over the right upper lobe. Consider repeat  frontal radiograph with removal of all leads. Electronically Signed   By: Abigail Miyamoto M.D.   On: 04/09/2022 14:32    Procedures Procedures   Medications Ordered in ED Medications - No data to display  ED Course/ Medical Decision Making/ A&P                           Medical Decision Making Amount and/or Complexity of Data Reviewed Labs: ordered. Radiology: ordered.   80 year old female presents to ED for evaluation.  Please see HPI for further details.  On my examination the patient is afebrile, nontachycardic.  The patient has a normal sinus rhythm on the monitor.  The patient is not hypoxic, her lung sounds are clear bilaterally.  The patient abdomen is soft and compressible throughout.  The patient neurological examination shows no focal neurodeficits.  The patient has no facial droop, slurred speech.  The patient has equal strength upper and lower extremities bilaterally.  Due to patient complaint we will proceed with the following labs to include troponin x2, CBC, BMP, chest x-ray, EKG.  During my examination of the patient, she has not been tachycardic during her time here.  The patient  troponin was 2 and 3 respectively.  The patient BMP is unremarkable.  The patient CBC is unremarkable.  The patient chest x-ray shows no consolidations, effusions.  Patient EKG is nonischemic, nontachycardic.  Patient states that she feels "much better than earlier".  The patient states that she has a home health nurse coming out tomorrow for her twice yearly evaluation through her health insurance agency.  I have advised the patient that at this time there was no cause noted for her tachycardia.  Patient states that prior to her tachycardia she had been exerting herself for an hour and a half, high likelihood that she overexerted herself.  I have advised the patient to take more breaks while she is cleaning her house, stay hydrated as well as continue to check her pulse at home.  I have advised the patient to follow-up with her PCP for further management.  I have advised the patient that if she begins to develop chest pain, shortness of breath she should return to the ED for further management.  The patient voices understanding with all my instructions.  The patient is stable for discharge.  Final Clinical Impression(s) / ED Diagnoses Final diagnoses:  Overexertion, initial encounter  Tachycardia    Rx / DC Orders ED Discharge Orders     None         Azucena Cecil, PA-C 04/09/22 1915    Sherwood Gambler, MD 04/10/22 1701

## 2022-04-09 NOTE — Discharge Instructions (Addendum)
Please return to the ED with any new or worsening signs or symptoms Please follow-up with home health nurse tomorrow Please remain hydrated Please take more breaks while doing housework in the future

## 2022-04-10 DIAGNOSIS — E1142 Type 2 diabetes mellitus with diabetic polyneuropathy: Secondary | ICD-10-CM | POA: Diagnosis not present

## 2022-04-10 DIAGNOSIS — E785 Hyperlipidemia, unspecified: Secondary | ICD-10-CM | POA: Diagnosis not present

## 2022-04-10 DIAGNOSIS — K219 Gastro-esophageal reflux disease without esophagitis: Secondary | ICD-10-CM | POA: Diagnosis not present

## 2022-04-13 ENCOUNTER — Telehealth: Payer: Self-pay

## 2022-04-13 NOTE — Telephone Encounter (Signed)
        Patient  visited [facility] on 11/13    Telephone encounter attempt :  1st  A HIPAA compliant voice message was left requesting a return call.  Instructed patient to call back    [place]  [phone] [address], [place], [place] [address] Phone: [phone] Email: [email or web address]

## 2022-04-16 ENCOUNTER — Other Ambulatory Visit: Payer: Self-pay | Admitting: Family Medicine

## 2022-04-16 DIAGNOSIS — Z1231 Encounter for screening mammogram for malignant neoplasm of breast: Secondary | ICD-10-CM

## 2022-04-17 DIAGNOSIS — G500 Trigeminal neuralgia: Secondary | ICD-10-CM | POA: Diagnosis not present

## 2022-04-17 DIAGNOSIS — K219 Gastro-esophageal reflux disease without esophagitis: Secondary | ICD-10-CM | POA: Diagnosis not present

## 2022-04-17 DIAGNOSIS — R002 Palpitations: Secondary | ICD-10-CM | POA: Diagnosis not present

## 2022-04-17 DIAGNOSIS — E1142 Type 2 diabetes mellitus with diabetic polyneuropathy: Secondary | ICD-10-CM | POA: Diagnosis not present

## 2022-04-17 DIAGNOSIS — E041 Nontoxic single thyroid nodule: Secondary | ICD-10-CM | POA: Diagnosis not present

## 2022-05-13 NOTE — Progress Notes (Unsigned)
Cardiology Office Note:    Date:  05/14/2022   ID:  Alison Fields, DOB 1941/05/01, MRN [id number]  PCP:  Harlan Stains, MD  Cardiologist:  None   Referring MD: Harlan Stains, MD   Chief Complaint  Patient presents with   Atrial Fibrillation    History of Present Illness:    Alison Fields is a 82 y.o. female with a hx of hyperlipidemia, DM II, and referred for palpitations after ER visit 04/09/2022.Marland Kitchen   She is doing well.  She is in because of an episode of heart pounding and racing that occurred on April 07, 2022 starting around 11:20 AM.  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  She states that her heart rate was increased, she could hear her heart beating, it was erratic, the husband measured the heart rate and it was around 130 bpm.  She did have a digital device and some days later was able to pull a tracing that demonstrated atrial fibrillation with rapid ventricular response.  The entire episode of heart racing and pounding lasted less than 30 minutes.  She did go to the emergency room.  Blood testing was performed.  An EKG was done and demonstrated normal sinus rhythm but with diffuse ST abnormality.  Mother died of MI at age 29.  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 Swallow whole.   atorvastatin (LIPITOR) 10 MG tablet Take 10 mg by mouth daily.   Cranberry 405 MG CAPS Take by mouth.   fexofenadine (ALLEGRA) 180 MG tablet Take 180 mg by mouth daily.   metFORMIN  (GLUCOPHAGE-XR) 500 MG 24 hr tablet Take 500 mg by mouth daily with breakfast.   metroNIDAZOLE (METROCREAM) 0.75 % cream Apply topically 2 (two) times daily.   Multiple Vitamin (MULTIVITAMINS PO) Take by mouth.   multivitamin-lutein (OCUVITE-LUTEIN) CAPS capsule Take 1 capsule by mouth daily.   pregabalin (LYRICA) 50 MG capsule Take 50 mg by mouth at bedtime.   Turmeric 500 MG CAPS Take 2 capsules by mouth daily at 6 (six) AM.   [DISCONTINUED] pregabalin (LYRICA) 50 MG capsule Take 50 mg by mouth 3 (three) times daily.     Allergies:   Bactrim [sulfamethoxazole-trimethoprim], Camphor, Ciprofloxacin, Erythromycin, and Penicillins   Social History   Socioeconomic History   Marital status: Married    Spouse name: Not on file   Number of children: Not on file   Years of education: Not on file   Highest education level: Not on file  Occupational History   Not on file  Tobacco Use   Smoking status: Never    Passive exposure: Never   Smokeless tobacco: Never  Vaping Use   Vaping Use: Never used  Substance and Sexual Activity   Alcohol use: Yes   Drug use: Not Currently   Sexual activity: Not Currently    Birth control/protection: None  Other Topics Concern   Not on file  Social History Narrative   Not on file   Social Determinants of Health   Financial Resource Strain: Not on file  Food Insecurity: Not on file  Transportation Needs: Not on file  Physical Activity: Not on file  Stress: Not on file  Social Connections: Not on file     Family History: The patient's family history includes Breast cancer in her cousin, paternal aunt, paternal grandmother, and sister.  ROS:   Please see the history of present illness.    Decreased hearing. there is a family history of rheumatic heart disease, father, who died of endocarditis.  Her mother died of myocardial infarction at age 76.  All other systems reviewed and are negative.  EKGs/Labs/Other Studies Reviewed:    The following  studies were reviewed today: No cardiac imaging.    EKG:  EKG 04/09/2022 revealed NSR with diffuse ST depression and flattened T waves.  Recent Labs: 04/09/2022: BUN 15; Creatinine, Ser 0.61; Hemoglobin 13.9; Platelets 265; Potassium 4.0; Sodium 137  Recent Lipid Panel No results found for: "CHOL", "TRIG", "HDL", "CHOLHDL", "VLDL", "LDLCALC", "LDLDIRECT"  Physical Exam:    VS:  BP 120/60   Pulse 75   Ht '5\' 7"'$  (1.702 m)   Wt 152 lb 12.8 oz (69.3 kg)   SpO2 98%   BMI 23.93 kg/m     Wt Readings from Last 3 Encounters:  05/14/22 152 lb 12.8 oz (69.3 kg)  04/09/22 152 lb (68.9 kg)  10/02/21 151 lb 14.4 oz (68.9 kg)     GEN: Healthy appearing. No acute distress HEENT: Normal NECK: No JVD. LYMPHATICS: No lymphadenopathy CARDIAC: No murmur. RRR S4 but no S3 gallop, or edema. VASCULAR:  Normal Pulses. No bruits. RESPIRATORY:  Clear to auscultation without rales, wheezing or rhonchi  ABDOMEN: Soft, non-tender, non-distended, No pulsatile mass, MUSCULOSKELETAL: No deformity  SKIN: Warm and dry NEUROLOGIC:  Alert and oriented x 3 PSYCHIATRIC:  Normal affect   ASSESSMENT:    1. Paroxysmal atrial fibrillation (HCC)   2. Near syncope   3. Other hyperlipidemia   4. Prediabetes    PLAN:    In order of problems listed above:  2-week monitor.  Start coated aspirin 81 mg/day.  2D Doppler echocardiogram.  Report any recurrences and record on digital device if not wearing the 2-week monitor.  There is an elevated CHA2DS2-VASc score of 4.  Clinically this is the only episode of atrial fibrillation.  Will get more data before starting Does not recall occurring.  This was on her problem list. Not currently on therapy. This is a risk factor that could count against her when CHA2DS2-VASc score is calculated.  The natural history of atrial fibrillation was discussed.  The inability to cure and the possibility of recurrences was clearly stated.  Management strategies including rhythm  control with antiarrhythmic therapy and/or ablation,  rate control (beta-blocker therapy, digoxin, or AV node blocking CCB therapy), and  occasional need for pacemaker therapy were reviewed.  Stroke risk (as determined by CHADS VASC score >1). The requirement for and the duration/permanence of anti-coagulation therapy will be determined by context of individual risk factors and burden of AF.  Anticoagulation risks (vitamin K antagonists v DOAC therapy) were reviewed, highlighting the lower bleeding ris'@10RELATIVEDAYS'$ @ k and improved safety with DOAC's. Shared decision making was stressed.   CHA2DS2-VASc Score = 4  The patient's score is based upon: CHF History: 0 HTN History: 0 Diabetes History: 1 Stroke History: 0 Vascular Disease History: 0 Age Score: 2 Gender Score:  1  :1}     ASSESSMENT AND PLAN: Paroxysmal Atrial Fibrillation (ICD10:  I48.0) The patient's CHA2DS2-VASc score is 4, indicating a 4.8% annual risk of stroke.    Secondary Hypercoagulable State (ICD10:  D68.69) The patient is at significant risk for stroke/thromboembolism based upon her CHA2DS2-VASc Score of 4.  However, the patient is not on anticoagulation due to her high bleeding risk.      Signed,  Sinclair Grooms, MD    05/14/2022 10:41 AM    CHF History: 0 HTN History: 0 Diabetes History: 1 Stroke History: 0 Vascular Disease History: 0 Age Score: 2 Gender Score: 1       Medication Adjustments/Labs and Tests Ordered: Current medicines are reviewed at length with the patient today.  Concerns regarding medicines are outlined above.  Orders Placed This Encounter  Procedures   LONG TERM MONITOR (3-14 DAYS)   EKG 12-Lead   ECHOCARDIOGRAM COMPLETE   Meds ordered this encounter  Medications   aspirin EC 81 MG tablet    Sig: Take 1 tablet (81 mg total) by mouth daily. Swallow whole.    Patient Instructions  Medication Instructions:  Your physician has recommended you make the following change in your  medication:   1) START aspirin '81mg'$  daily (enteric coated)  *If you need a refill on your cardiac medications before your next appointment, please call your pharmacy*  Testing/Procedures: Your physician has requested that you have an echocardiogram. Echocardiography is a painless test that uses sound waves to create images of your heart. It provides your doctor with information about the size and shape of your heart and how well your heart's chambers and valves are working. This procedure takes approximately one hour. There are no restrictions for this procedure. Please do NOT wear cologne, perfume, aftershave, or lotions (deodorant is allowed). Please arrive 15 minutes prior to your appointment time.  Your physician has requested that you wear a Zio heart monitor for 14 days. This will be mailed to your home with instructions on how to apply the monitor and how to return it when finished. Please allow 2 weeks after returning the heart monitor before our office calls you with the results.   Follow-Up: At [facility], you and your health needs are our priority.  As part of our continuing mission to provide you with exceptional heart care, we have created designated Provider Care Teams.  These Care Teams include your primary Cardiologist (physician) and Advanced Practice Providers (APPs -  Physician Assistants and Nurse Practitioners) who all work together to provide you with the care you need, when you need it.  Your next appointment:   4-6 week(s)  The format for your next appointment:   In Person  Provider:   Melina Copa, PA-C, Ambrose Pancoast, NP, Christen Bame, NP, or Richardson Dopp, PA-C or Candee Furbish, MD or Rudean Haskell, MD or Gwyndolyn Kaufman, MD  Other Instructions [name]- Long Term Monitor Instructions     Your physician has requested you wear a ZIO patch monitor for 14 days.  This is a single patch monitor. [employer] supplies one patch monitor per enrollment.  Additional  stickers are not available. Please do not apply patch if you will be having a Nuclear Stress Test,  Echocardiogram, Cardiac CT, MRI, or Chest Xray during the period you would be wearing the  monitor. The patch cannot be worn during these tests. You cannot remove and re-apply the  ZIO XT patch monitor.  Your ZIO patch  monitor will be mailed 3 day USPS to your address on file. It may take 3-5 days  to receive your monitor after you have been enrolled.  Once you have received your monitor, please review the enclosed instructions. Your monitor  has already been registered assigning a specific monitor serial # to you.     Billing and Patient Assistance Program Information     We have supplied [employer] with any of your insurance information on file for billing purposes.  [employer] offers a sliding scale Patient Assistance Program for patients that do not have  insurance, or whose insurance does not completely cover the cost of the ZIO monitor.  You must apply for the Patient Assistance Program to qualify for this discounted rate.  To apply, please call [employer] at [phone], select option 4, select option 2, ask to apply for  Patient Assistance Program. [name] will ask your household income, and how many people  are in your household. They will quote your out-of-pocket cost based on that information.  [employer] will also be able to set up a 37-month interest-free payment plan if needed.     Applying the monitor     Shave hair from upper left chest.  Hold abrader disc by orange tab. Rub abrader in 40 strokes over the upper left chest as  indicated in your monitor instructions.  Clean area with 4 enclosed alcohol pads. Let dry.  Apply patch as indicated in monitor instructions. Patch will be placed under collarbone on left  side of chest with arrow pointing upward.  Rub patch adhesive wings for 2 minutes. Remove white label marked "1". Remove the white  label marked "2". Rub patch  adhesive wings for 2 additional minutes.  While looking in a mirror, press and release button in center of patch. A small green light will  flash 3-4 times. This will be your only indicator that the monitor has been turned on.  Do not shower for the first 24 hours. You may shower after the first 24 hours.  Press the button if you feel a symptom. You will hear a small click. Record Date, Time and  Symptom in the Patient Logbook.  When you are ready to remove the patch, follow instructions on the last 2 pages of Patient  Logbook. Stick patch monitor onto the last page of Patient Logbook.  Place Patient Logbook in the blue and white box. Use locking tab on box and tape box closed  securely. The blue and white box has prepaid postage on it. Please place it in the mailbox as  soon as possible. Your physician should have your test results approximately 7 days after the  monitor has been [facility]  Call IPembrokeat [phone] you have questions regarding  your ZIO XT patch monitor. Call them immediately if you see an orange light blinking on your  monitor.  If your monitor falls off in less than 4 days, contact our Monitor department at [phone]  If your monitor becomes loose or falls off after 4 days call [employer] at [phone]  suggestions on securing your monitor.    Important Information About Sugar         Signed, HSinclair Grooms MD  05/14/2022 10:41 AM    CFort Greely

## 2022-05-14 ENCOUNTER — Encounter: Payer: Self-pay | Admitting: Interventional Cardiology

## 2022-05-14 ENCOUNTER — Ambulatory Visit (INDEPENDENT_AMBULATORY_CARE_PROVIDER_SITE_OTHER): Payer: Medicare Other

## 2022-05-14 ENCOUNTER — Ambulatory Visit: Payer: Medicare Other | Attending: Interventional Cardiology | Admitting: Interventional Cardiology

## 2022-05-14 VITALS — BP 120/60 | HR 75 | Ht 67.0 in | Wt 152.8 lb

## 2022-05-14 DIAGNOSIS — R7303 Prediabetes: Secondary | ICD-10-CM

## 2022-05-14 DIAGNOSIS — I480 Paroxysmal atrial fibrillation: Secondary | ICD-10-CM

## 2022-05-14 DIAGNOSIS — R55 Syncope and collapse: Secondary | ICD-10-CM | POA: Diagnosis not present

## 2022-05-14 DIAGNOSIS — E7849 Other hyperlipidemia: Secondary | ICD-10-CM | POA: Diagnosis not present

## 2022-05-14 MED ORDER — ASPIRIN 81 MG PO TBEC: 81.0000 mg | DELAYED_RELEASE_TABLET | Freq: Every day | ORAL | Status: AC

## 2022-05-14 NOTE — Progress Notes (Unsigned)
Enrolled for [employer] to mail a ZIO XT long term holter monitor to the patients address on file.  

## 2022-05-14 NOTE — Patient Instructions (Addendum)
Medication Instructions:  Your physician has recommended you make the following change in your medication:   1) START aspirin '81mg'$  daily (enteric coated)  *If you need a refill on your cardiac medications before your next appointment, please call your pharmacy*  Testing/Procedures: Your physician has requested that you have an echocardiogram. Echocardiography is a painless test that uses sound waves to create images of your heart. It provides your doctor with information about the size and shape of your heart and how well your heart's chambers and valves are working. This procedure takes approximately one hour. There are no restrictions for this procedure. Please do NOT wear cologne, perfume, aftershave, or lotions (deodorant is allowed). Please arrive 15 minutes prior to your appointment time.  Your physician has requested that you wear a Zio heart monitor for 14 days. This will be mailed to your home with instructions on how to apply the monitor and how to return it when finished. Please allow 2 weeks after returning the heart monitor before our office calls you with the results.   Follow-Up: At [facility], you and your health needs are our priority.  As part of our continuing mission to provide you with exceptional heart care, we have created designated Provider Care Teams.  These Care Teams include your primary Cardiologist (physician) and Advanced Practice Providers (APPs -  Physician Assistants and Nurse Practitioners) who all work together to provide you with the care you need, when you need it.  Your next appointment:   4-6 week(s)  The format for your next appointment:   In Person  Provider:   Melina Copa, PA-C, Alison Pancoast, NP, Alison Bame, NP, or Alison Dopp, PA-C or Alison Furbish, MD or Alison Haskell, MD or Alison Kaufman, MD  Other Instructions Alison Fields- Long Term Monitor Instructions     Your physician has requested you wear a ZIO patch monitor for 14  days.  This is a single patch monitor. [employer] supplies one patch monitor per enrollment. Additional  stickers are not available. Please do not apply patch if you will be having a Nuclear Stress Test,  Echocardiogram, Cardiac CT, MRI, or Chest Xray during the period you would be wearing the  monitor. The patch cannot be worn during these tests. You cannot remove and re-apply the  ZIO XT patch monitor.  Your ZIO patch monitor will be mailed 3 day USPS to your address on file. It may take 3-5 days  to receive your monitor after you have been enrolled.  Once you have received your monitor, please review the enclosed instructions. Your monitor  has already been registered assigning a specific monitor serial # to you.     Billing and Patient Assistance Program Information     We have supplied [employer] with any of your insurance information on file for billing purposes.  [employer] offers a sliding scale Patient Assistance Program for patients that do not have  insurance, or whose insurance does not completely cover the cost of the ZIO monitor.  You must apply for the Patient Assistance Program to qualify for this discounted rate.  To apply, please call [employer] at [phone], select option 4, select option 2, ask to apply for  Patient Assistance Program. Alison Fields will ask your household income, and how many people  are in your household. They will quote your out-of-pocket cost based on that information.  [employer] will also be able to set up a 42-month interest-free payment plan if needed.     Applying the  monitor     Shave hair from upper left chest.  Hold abrader disc by orange tab. Rub abrader in 40 strokes over the upper left chest as  indicated in your monitor instructions.  Clean area with 4 enclosed alcohol pads. Let dry.  Apply patch as indicated in monitor instructions. Patch will be placed under collarbone on left  side of chest with arrow pointing upward.  Rub patch adhesive wings for  2 minutes. Remove white label marked "1". Remove the white  label marked "2". Rub patch adhesive wings for 2 additional minutes.  While looking in a mirror, press and release button in center of patch. A small green light will  flash 3-4 times. This will be your only indicator that the monitor has been turned on.  Do not shower for the first 24 hours. You may shower after the first 24 hours.  Press the button if you feel a symptom. You will hear a small click. Record Date, Time and  Symptom in the Patient Logbook.  When you are ready to remove the patch, follow instructions on the last 2 pages of Patient  Logbook. Stick patch monitor onto the last page of Patient Logbook.  Place Patient Logbook in the blue and white box. Use locking tab on box and tape box closed  securely. The blue and white box has prepaid postage on it. Please place it in the mailbox as  soon as possible. Your physician should have your test results approximately 7 days after the  monitor has been mailed back to [facility].  Call Alison Fields at [phone] if you have questions regarding  your ZIO XT patch monitor. Call them immediately if you see an orange light blinking on your  monitor.  If your monitor falls off in less than 4 days, contact our Monitor department at [phone].  If your monitor becomes loose or falls off after 4 days call [employer] at [phone] for  suggestions on securing your monitor.    Important Information About Sugar

## 2022-05-15 DIAGNOSIS — I480 Paroxysmal atrial fibrillation: Secondary | ICD-10-CM | POA: Diagnosis not present

## 2022-06-04 DIAGNOSIS — I480 Paroxysmal atrial fibrillation: Secondary | ICD-10-CM | POA: Diagnosis not present

## 2022-06-13 ENCOUNTER — Ambulatory Visit: Payer: Medicare Other

## 2022-06-14 ENCOUNTER — Ambulatory Visit
Admission: RE | Admit: 2022-06-14 | Discharge: 2022-06-14 | Disposition: A | Discharge status: Home / Self Care | Payer: Medicare Other | Source: Ambulatory Visit | Attending: Family Medicine | Admitting: Family Medicine

## 2022-06-14 ENCOUNTER — Ambulatory Visit (HOSPITAL_COMMUNITY): Payer: Medicare Other | Attending: Internal Medicine

## 2022-06-14 DIAGNOSIS — I480 Paroxysmal atrial fibrillation: Secondary | ICD-10-CM | POA: Diagnosis not present

## 2022-06-14 DIAGNOSIS — Z1231 Encounter for screening mammogram for malignant neoplasm of breast: Secondary | ICD-10-CM

## 2022-06-14 LAB — ECHOCARDIOGRAM COMPLETE
Area-P 1/2: 2.74 cm2
S' Lateral: 2.3 cm

## 2022-06-27 ENCOUNTER — Telehealth: Payer: Self-pay | Admitting: Cardiology

## 2022-06-27 NOTE — Telephone Encounter (Signed)
Pt is returning call in regards to results. Requesting call back.  

## 2022-06-27 NOTE — Telephone Encounter (Signed)
-----  Message from Freada Bergeron, MD sent at 06/27/2022  8:21 AM EST ----- Her heart monitor looked good with no episodes of atrial fibrillation. She has brief runs of SVT but they did not sustain or correlate with patient triggered events. No changes in medications at this time.

## 2022-06-27 NOTE — Telephone Encounter (Signed)
The patient has been notified of the result and verbalized understanding.  All questions (if any) were answered. Nuala Alpha, LPN 6/82/5749 3:55 AM

## 2022-06-30 NOTE — Progress Notes (Unsigned)
Cardiology Office Note:    Date:  07/02/2022   ID:  Alison Fields, DOB 19-May-1941, MRN [id number]  PCP:  Harlan Stains, MD   [place] Providers Cardiologist:  None    Referring MD: Harlan Stains, MD    History of Present Illness:    Alison Fields is a 82 y.o. female with a hx of HLD, DMII, and history of breast cancer who presents to clinic for follow-up.  Was seen by Dr. Tamala Julian in 04/2022 for episodes of palpitations for which she was seen in the ER found to be in Afib at that time. She was started on ASA by Dr. Tamala Julian and a 2 week monitor was placed. Cardiac monitor showed 06/05/22 which showed no Afib. TTE 06/14/22 with LVEF 60-65%, G1DD, normal RV, trivial MR.  Today, the patient overall feels well today. States that she has had no recurrence of her palpitations and her apple watch has detected no further episodes of Afib. She does state that she has been having a pressure/fullness in the center of her chest with walking. Symptoms only occur with exertion about 0.4mles into her walk and it stays about the same throughout the duration of the walk lasting up to 1.750mes. Symptoms resolve by the time she sits and rests. Symptoms do not improve with tums. No symptoms at rest.   Past Medical History:  Diagnosis Date   [id number]   Invasive ductal carcinoma   Diabetes mellitus without complication (HCC)    Foot ulcer (HCC)    High cholesterol    Palpitations    Palpitations    Personal history of radiation therapy    left   Thyroid nodule    Trigeminal neuralgia     Past Surgical History:  Procedure Laterality Date   BREAST LUMPECTOMY Left 2009    Current Medications: Current Meds  Medication Sig   aspirin EC 81 MG tablet Take 1 tablet (81 mg total) by mouth daily. Swallow whole.   atorvastatin (LIPITOR) 10 MG tablet Take 10 mg by mouth daily.   Cranberry 405 MG CAPS Take by mouth.   famotidine (PEPCID) 20 MG tablet Take 20 mg by mouth daily.    fexofenadine (ALLEGRA) 180 MG tablet Take 180 mg by mouth daily.   metFORMIN (GLUCOPHAGE-XR) 500 MG 24 hr tablet Take 500 mg by mouth daily with breakfast.   metoprolol tartrate (LOPRESSOR) 100 MG tablet Take 1 tablet (100 mg total) by mouth once for 1 dose. Take 90-120 minutes prior to scan.   metroNIDAZOLE (METROCREAM) 0.75 % cream Apply topically 2 (two) times daily.   Multiple Vitamin (MULTIVITAMINS PO) Take by mouth.   multivitamin-lutein (OCUVITE-LUTEIN) CAPS capsule Take 1 capsule by mouth daily.   nitroGLYCERIN (NITROSTAT) 0.4 MG SL tablet Place 1 tablet (0.4 mg total) under the tongue every 5 (five) minutes as needed for chest pain.   pregabalin (LYRICA) 50 MG capsule Take 50 mg by mouth at bedtime.   Turmeric 500 MG CAPS Take 2 capsules by mouth daily at 6 (six) AM.     Allergies:   Bactrim [sulfamethoxazole-trimethoprim], Camphor, Ciprofloxacin, Erythromycin, and Penicillins   Social History   Socioeconomic History   Marital status: Married    Spouse name: Not on file   Number of children: Not on file   Years of education: Not on file   Highest education level: Not on file  Occupational History   Not on file  Tobacco Use   Smoking status: Never  Passive exposure: Never   Smokeless tobacco: Never  Vaping Use   Vaping Use: Never used  Substance and Sexual Activity   Alcohol use: Yes   Drug use: Not Currently   Sexual activity: Not Currently    Birth control/protection: None  Other Topics Concern   Not on file  Social History Narrative   Not on file   Social Determinants of Health   Financial Resource Strain: Not on file  Food Insecurity: Not on file  Transportation Needs: Not on file  Physical Activity: Not on file  Stress: Not on file  Social Connections: Not on file     Family History: The patient's family history includes Breast cancer in her cousin, paternal aunt, paternal grandmother, and sister.  ROS:   Please see the history of present  illness.     All other systems reviewed and are negative.  EKGs/Labs/Other Studies Reviewed:    The following studies were reviewed today: Cardiac Monitor 05/2022:   Patch wear time is 13 days and 21 hours   Predominant rhythm was NSR with average HR 71bpm   There were 9 runs of SVT with longest lasting 19.9seconds   Rare SVE (<1%), rare VE (<1%)   No Afib or significant pauses   Patient triggered events correlate with sinus rhythm  TTE 06/14/22: IMPRESSIONS     1. Left ventricular ejection fraction, by estimation, is 60 to 65%. The  left ventricle has normal function. The left ventricle has no regional  wall motion abnormalities. There is mild left ventricular hypertrophy.  Left ventricular diastolic parameters  are consistent with Grade I diastolic dysfunction (impaired relaxation).   2. Right ventricular systolic function is normal. The right ventricular  size is normal. Tricuspid regurgitation signal is inadequate for assessing  PA pressure.   3. The mitral valve is abnormal. Trivial mitral valve regurgitation.   4. The aortic valve is tricuspid. Aortic valve regurgitation is not  visualized.   Comparison(s): Changes from prior study are noted. 02/03/2018: LVEF 55-60%.    EKG:  EKG is not ordered today.    Recent Labs: 04/09/2022: BUN 15; Creatinine, Ser 0.61; Hemoglobin 13.9; Platelets 265; Potassium 4.0; Sodium 137  Recent Lipid Panel No results found for: "CHOL", "TRIG", "HDL", "CHOLHDL", "VLDL", "LDLCALC", "LDLDIRECT"   Risk Assessment/Calculations:    CHA2DS2-VASc Score = 4   This indicates a 4.8% annual risk of stroke. The patient's score is based upon: CHF History: 0 HTN History: 0 Diabetes History: 1 Stroke History: 0 Vascular Disease History: 0 Age Score: 2 Gender Score: 1               Physical Exam:    VS:  BP 120/70   Pulse 77   Ht '5\' 7"'$  (1.702 m)   Wt 153 lb 9.6 oz (69.7 kg)   SpO2 97%   BMI 24.06 kg/m     Wt Readings from Last 3  Encounters:  07/02/22 153 lb 9.6 oz (69.7 kg)  05/14/22 152 lb 12.8 oz (69.3 kg)  04/09/22 152 lb (68.9 kg)     GEN:  Well nourished, well developed in no acute distress HEENT: Normal NECK: No JVD; No carotid bruits CARDIAC: RRR, no murmurs, rubs, gallops RESPIRATORY:  Clear to auscultation without rales, wheezing or rhonchi  ABDOMEN: Soft, non-tender, non-distended MUSCULOSKELETAL:  No edema; No deformity  SKIN: Warm and dry NEUROLOGIC:  Alert and oriented x 3 PSYCHIATRIC:  Normal affect   ASSESSMENT:    1. Precordial pain  2. Paroxysmal atrial fibrillation (HCC)   3. Other hyperlipidemia    PLAN:    In order of problems listed above:  #Chest Pressure: Patient reports chest pressure with walking that resolves with rest. Symptoms have been present over the past couple of months and have remains stable. No associated SOB or palpitations. No symptoms at rest. Symptoms concerning for possible angina, will check coronary CTA for further evaluation. -Check coronary CTA -Will give nitro as needed -Continue ASA '81mg'$  daily, lipitor '10mg'$  daily  #Paroxysmal Afib: CHADs-vasc 4. Isolated episode on home monitor with no recurrence on 2 week zio. Has been maintained on ASA. Denies any recurrence of symptoms. Declined loop today but will continue to monitor with apple watch. -Declined loop; will continue to monitor with apple watch -Not on [facility] yet due to isolated, brief episode. She is monitoring as above.  #HLD: -Continue lipitor '10mg'$  daily -LDL well controlled at 44  #DMII: -Continue metformin '500mg'$  daily            Medication Adjustments/Labs and Tests Ordered: Current medicines are reviewed at length with the patient today.  Concerns regarding medicines are outlined above.  Orders Placed This Encounter  Procedures   CT CORONARY MORPH W/CTA COR W/SCORE W/CA W/CM &/OR WO/CM   Basic metabolic panel   Meds ordered this encounter  Medications   metoprolol tartrate  (LOPRESSOR) 100 MG tablet    Sig: Take 1 tablet (100 mg total) by mouth once for 1 dose. Take 90-120 minutes prior to scan.    Dispense:  1 tablet    Refill:  0   nitroGLYCERIN (NITROSTAT) 0.4 MG SL tablet    Sig: Place 1 tablet (0.4 mg total) under the tongue every 5 (five) minutes as needed for chest pain.    Dispense:  30 tablet    Refill:  3    Patient Instructions  Medication Instructions:   NITROGLYCERIN 0.4 MG SUBLINGUAL (UNDER THE TONGUE) --PLACE ONE TABLET UNDER THE TONGUE EVERY 5 MINS AS NEEDED FOR CHEST PAIN--IF YOU ARE GETTING TO TABLET 3 WITH NO CHEST PAIN RELIEF, PLEASE CALL 911  *If you need a refill on your cardiac medications before your next appointment, please call your pharmacy*    Testing/Procedures:    Your cardiac CT will be scheduled at one of the below locations:   [facility] [address] [place], [place] [address] [phone]   If scheduled at [facility], please arrive at the [facility] and Children's Entrance (Entrance C2) of [facility] 30 minutes prior to test start time. You can use the FREE valet parking offered at entrance C (encouraged to control the heart rate for the test)  Proceed to the [facility] Radiology Department (first floor) to check-in and test prep.  All radiology patients and guests should use entrance C2 at [facility], accessed from [facility], even though the hospital's physical address listed is [address]      Please follow these instructions carefully (unless otherwise directed):  Hold all erectile dysfunction medications at least 3 days (72 hrs) prior to test. (Ie viagra, cialis, sildenafil, tadalafil, etc) We will administer nitroglycerin during this exam.   On the Night Before the Test: Be sure to Drink plenty of water. Do not consume any caffeinated/decaffeinated beverages or chocolate 12 hours prior to your test. Do not take any antihistamines 12  hours prior to your test.   On the Day of the Test: Drink plenty of  water until 1 hour prior to the test. Do not eat any food 1 hour prior to test. You may take your regular medications prior to the test.  Take metoprolol 100 MG BY MOUTH (Lopressor) two hours prior to test. FEMALES- please wear underwire-free bra if available, avoid dresses & tight clothing        After the Test: Drink plenty of water. After receiving IV contrast, you may experience a mild flushed feeling. This is normal. On occasion, you may experience a mild rash up to 24 hours after the test. This is not dangerous. If this occurs, you can take Benadryl 25 mg and increase your fluid intake. If you experience trouble breathing, this can be serious. If it is severe call 911 IMMEDIATELY. If it is mild, please call our office. If you take any of these medications: Glipizide/Metformin, Avandament, Glucavance, please do not take 48 hours after completing test unless otherwise instructed.  We will call to schedule your test 2-4 weeks out understanding that some insurance companies will need an authorization prior to the service being performed.   For non-scheduling related questions, please contact the cardiac imaging nurse navigator should you have any questions/concerns: Marchia Bond, Cardiac Imaging Nurse Navigator Gordy Clement, Cardiac Imaging Nurse Navigator [facility] Direct Office Dial: [phone]   For scheduling needs, including cancellations and rescheduling, please call [place], [phone].    Follow-Up:  3 MONTHS WITH AN EXTENDER IN THE OFFICE--PLEASE SCHEDULE WITH AN EXTENDER PER DR. Johney Frame     Signed, Freada Bergeron, MD  07/02/2022 12:02 PM    [place]

## 2022-07-02 ENCOUNTER — Telehealth: Payer: Self-pay | Admitting: *Deleted

## 2022-07-02 ENCOUNTER — Ambulatory Visit: Payer: Medicare Other | Attending: Cardiology | Admitting: Cardiology

## 2022-07-02 ENCOUNTER — Encounter: Payer: Self-pay | Admitting: Cardiology

## 2022-07-02 VITALS — BP 120/70 | HR 77 | Ht 67.0 in | Wt 153.6 lb

## 2022-07-02 DIAGNOSIS — I480 Paroxysmal atrial fibrillation: Secondary | ICD-10-CM | POA: Diagnosis not present

## 2022-07-02 DIAGNOSIS — E7849 Other hyperlipidemia: Secondary | ICD-10-CM | POA: Diagnosis not present

## 2022-07-02 DIAGNOSIS — R072 Precordial pain: Secondary | ICD-10-CM | POA: Diagnosis not present

## 2022-07-02 MED ORDER — NITROGLYCERIN 0.4 MG SL SUBL: 0.4000 mg | SUBLINGUAL_TABLET | SUBLINGUAL | 3 refills | Status: DC | PRN

## 2022-07-02 MED ORDER — METOPROLOL TARTRATE 100 MG PO TABS: 100.0000 mg | ORAL_TABLET | Freq: Once | ORAL | 0 refills | Status: DC

## 2022-07-02 NOTE — Patient Instructions (Signed)
Medication Instructions:   NITROGLYCERIN 0.4 MG SUBLINGUAL (UNDER THE TONGUE) --PLACE ONE TABLET UNDER THE TONGUE EVERY 5 MINS AS NEEDED FOR CHEST PAIN--IF YOU ARE GETTING TO TABLET 3 WITH NO CHEST PAIN RELIEF, PLEASE CALL 911  *If you need a refill on your cardiac medications before your next appointment, please call your pharmacy*    Testing/Procedures:    Your cardiac CT will be scheduled at one of the below locations:   [facility] [address] [place], [place] [address] [phone]   If scheduled at [facility], please arrive at the [facility] and Children's Entrance (Entrance C2) of [facility] 30 minutes prior to test start time. You can use the FREE valet parking offered at entrance C (encouraged to control the heart rate for the test)  Proceed to the [facility] Radiology Department (first floor) to check-in and test prep.  All radiology patients and guests should use entrance C2 at [facility], accessed from [facility], even though the hospital's physical address listed is [address]      Please follow these instructions carefully (unless otherwise directed):  Hold all erectile dysfunction medications at least 3 days (72 hrs) prior to test. (Ie viagra, cialis, sildenafil, tadalafil, etc) We will administer nitroglycerin during this exam.   On the Night Before the Test: Be sure to Drink plenty of water. Do not consume any caffeinated/decaffeinated beverages or chocolate 12 hours prior to your test. Do not take any antihistamines 12 hours prior to your test.   On the Day of the Test: Drink plenty of water until 1 hour prior to the test. Do not eat any food 1 hour prior to test. You may take your regular medications prior to the test.  Take metoprolol 100 MG BY MOUTH (Lopressor) two hours prior to test. FEMALES- please wear underwire-free bra if available, avoid dresses & tight clothing        After  the Test: Drink plenty of water. After receiving IV contrast, you may experience a mild flushed feeling. This is normal. On occasion, you may experience a mild rash up to 24 hours after the test. This is not dangerous. If this occurs, you can take Benadryl 25 mg and increase your fluid intake. If you experience trouble breathing, this can be serious. If it is severe call 911 IMMEDIATELY. If it is mild, please call our office. If you take any of these medications: Glipizide/Metformin, Avandament, Glucavance, please do not take 48 hours after completing test unless otherwise instructed.  We will call to schedule your test 2-4 weeks out understanding that some insurance companies will need an authorization prior to the service being performed.   For non-scheduling related questions, please contact the cardiac imaging nurse navigator should you have any questions/concerns: Marchia Bond, Cardiac Imaging Nurse Navigator Gordy Clement, Cardiac Imaging Nurse Navigator [place] Direct Office Dial: [phone]   For scheduling needs, including cancellations and rescheduling, please call [place], [phone].    Follow-Up:  3 MONTHS WITH AN EXTENDER IN THE OFFICE--PLEASE SCHEDULE WITH AN EXTENDER PER DR. Johney Frame

## 2022-07-02 NOTE — Telephone Encounter (Signed)
-----   Message from [place] sent at 07/02/2022  5:40 PM EST ----- Regarding: ct scan Scheduled 07/09/21 at 11:00

## 2022-07-05 ENCOUNTER — Telehealth (HOSPITAL_COMMUNITY): Payer: Self-pay | Admitting: Emergency Medicine

## 2022-07-05 NOTE — Telephone Encounter (Signed)
Reaching out to patient to offer assistance regarding upcoming cardiac imaging study; pt verbalizes understanding of appt date/time, parking situation and where to check in, pre-test NPO status and medications ordered, and verified current allergies; name and call back number provided for further questions should they arise Alison Bond RN Navigator Cardiac Imaging [facility] [phone] office [phone] cell  Getting labs tomorrow Arrival 1030 WC entrance  Denies iv issues Aware contrast/nitro '100mg'$  metoprolol tart

## 2022-07-06 ENCOUNTER — Ambulatory Visit: Payer: Medicare Other | Attending: Cardiology

## 2022-07-06 DIAGNOSIS — R072 Precordial pain: Secondary | ICD-10-CM | POA: Diagnosis not present

## 2022-07-07 LAB — BASIC METABOLIC PANEL
BUN/Creatinine Ratio: 25 (ref 12–28)
BUN: 16 mg/dL (ref 8–27)
CO2: 27 mmol/L (ref 20–29)
Calcium: 9.4 mg/dL (ref 8.7–10.3)
Chloride: 102 mmol/L (ref 96–106)
Creatinine, Ser: 0.65 mg/dL (ref 0.57–1.00)
Glucose: 87 mg/dL (ref 70–99)
Potassium: 4.5 mmol/L (ref 3.5–5.2)
Sodium: 140 mmol/L (ref 134–144)
eGFR: 88 mL/min/{1.73_m2} (ref >59)

## 2022-07-09 ENCOUNTER — Ambulatory Visit (HOSPITAL_COMMUNITY)
Admission: RE | Admit: 2022-07-09 | Discharge: 2022-07-09 | Disposition: A | Discharge status: Home / Self Care | Payer: Medicare Other | Source: Ambulatory Visit | Attending: Cardiology | Admitting: Cardiology

## 2022-07-09 ENCOUNTER — Other Ambulatory Visit: Payer: Self-pay | Admitting: Internal Medicine

## 2022-07-09 DIAGNOSIS — R931 Abnormal findings on diagnostic imaging of heart and coronary circulation: Secondary | ICD-10-CM

## 2022-07-09 DIAGNOSIS — R072 Precordial pain: Secondary | ICD-10-CM | POA: Insufficient documentation

## 2022-07-09 DIAGNOSIS — I2510 Atherosclerotic heart disease of native coronary artery without angina pectoris: Secondary | ICD-10-CM

## 2022-07-09 MED ORDER — IOHEXOL 350 MG/ML SOLN
95.0000 mL | Freq: Once | INTRAVENOUS | Status: AC | PRN
  Administered 2022-07-09: 95 mL via INTRAVENOUS

## 2022-07-09 MED ORDER — NITROGLYCERIN 0.4 MG SL SUBL
0.8000 mg | SUBLINGUAL_TABLET | Freq: Once | SUBLINGUAL | Status: AC
  Administered 2022-07-09: 0.8 mg via SUBLINGUAL

## 2022-07-09 MED ORDER — NITROGLYCERIN 0.4 MG SL SUBL
SUBLINGUAL_TABLET | SUBLINGUAL | Status: AC
Start: 2022-07-09 — End: 2022-07-09
  Filled 2022-07-09: qty 2

## 2022-07-10 ENCOUNTER — Ambulatory Visit (HOSPITAL_BASED_OUTPATIENT_CLINIC_OR_DEPARTMENT_OTHER)
Admission: RE | Admit: 2022-07-10 | Discharge: 2022-07-10 | Disposition: A | Discharge status: Home / Self Care | Payer: Medicare Other | Source: Ambulatory Visit | Attending: Internal Medicine | Admitting: Internal Medicine

## 2022-07-10 DIAGNOSIS — R931 Abnormal findings on diagnostic imaging of heart and coronary circulation: Secondary | ICD-10-CM | POA: Diagnosis not present

## 2022-07-10 DIAGNOSIS — R072 Precordial pain: Secondary | ICD-10-CM | POA: Diagnosis not present

## 2022-07-12 ENCOUNTER — Ambulatory Visit: Payer: Medicare Other | Attending: Cardiology | Admitting: Cardiology

## 2022-07-12 ENCOUNTER — Encounter: Payer: Self-pay | Admitting: Cardiology

## 2022-07-12 VITALS — BP 112/66 | HR 68 | Ht 67.0 in | Wt 151.6 lb

## 2022-07-12 DIAGNOSIS — I25118 Atherosclerotic heart disease of native coronary artery with other forms of angina pectoris: Secondary | ICD-10-CM

## 2022-07-12 DIAGNOSIS — E7849 Other hyperlipidemia: Secondary | ICD-10-CM | POA: Diagnosis not present

## 2022-07-12 DIAGNOSIS — I480 Paroxysmal atrial fibrillation: Secondary | ICD-10-CM | POA: Diagnosis not present

## 2022-07-12 DIAGNOSIS — R072 Precordial pain: Secondary | ICD-10-CM | POA: Diagnosis not present

## 2022-07-12 DIAGNOSIS — R55 Syncope and collapse: Secondary | ICD-10-CM | POA: Diagnosis not present

## 2022-07-12 DIAGNOSIS — R931 Abnormal findings on diagnostic imaging of heart and coronary circulation: Secondary | ICD-10-CM

## 2022-07-12 MED ORDER — METOPROLOL SUCCINATE ER 25 MG PO TB24: 12.5000 mg | ORAL_TABLET | Freq: Every day | ORAL | 3 refills | Status: DC

## 2022-07-12 MED ORDER — ISOSORBIDE MONONITRATE ER 30 MG PO TB24: 15.0000 mg | ORAL_TABLET | Freq: Every day | ORAL | 3 refills | Status: DC

## 2022-07-12 MED ORDER — ATORVASTATIN CALCIUM 20 MG PO TABS: 20.0000 mg | ORAL_TABLET | Freq: Every day | ORAL | 3 refills | Status: DC

## 2022-07-12 NOTE — Progress Notes (Signed)
Cardiology Office Note:    Date:  07/12/2022   ID:  Alison Fields, DOB 08/24/1940, MRN [id number]  PCP:  Harlan Stains, MD   [facility] Providers Cardiologist:  None    Referring MD: Harlan Stains, MD    History of Present Illness:    Alison Fields is a 82 y.o. female with a hx of HLD, DMII, and history of breast cancer who presents to clinic for follow-up.  Was seen by Dr. Tamala Julian in 04/2022 for episodes of palpitations for which she was seen in the ER found to be in Afib at that time. She was started on ASA by Dr. Tamala Julian and a 2 week monitor was placed. Cardiac monitor showed 06/05/22 which showed no Afib. TTE 06/14/22 with LVEF 60-65%, G1DD, normal RV, trivial MR.  Was seen in clinic in 07/02/22 where she was complaining of chest pain on exertion that was relieved with rest. Coronary CTA revealed likely CTO of the distal LAD with ostial 50-69% stenosis. RCA and Lcx with mild disease. She is now returning to clinic to discuss results.   Today, the patient feels well. Has cut back on activity due to recent CT scan results, but has not had chest pain since that time. No SOB, orthopnea, PND, LE edema, or palpitations. Tolerating medications as prescribed. Discussed CTA results at length and the goal to trial medical therapy and pursue cath if symptoms persist.   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 Swallow whole.   atorvastatin (LIPITOR) 20 MG tablet Take 1 tablet (20 mg total) by mouth daily.    Cranberry 405 MG CAPS Take by mouth.   famotidine (PEPCID) 20 MG tablet Take 20 mg by mouth daily.   fexofenadine (ALLEGRA) 180 MG tablet Take 180 mg by mouth daily.   isosorbide mononitrate (IMDUR) 30 MG 24 hr tablet Take 0.5 tablets (15 mg total) by mouth daily.   metFORMIN (GLUCOPHAGE-XR) 500 MG 24 hr tablet Take 500 mg by mouth daily with breakfast.   metoprolol succinate (TOPROL XL) 25 MG 24 hr tablet Take 0.5 tablets (12.5 mg total) by mouth at bedtime.   metroNIDAZOLE (METROCREAM) 0.75 % cream Apply topically 2 (two) times daily.   Multiple Vitamin (MULTIVITAMINS PO) Take by mouth.   multivitamin-lutein (OCUVITE-LUTEIN) CAPS capsule Take 1 capsule by mouth daily.   nitroGLYCERIN (NITROSTAT) 0.4 MG SL tablet Place 1 tablet (0.4 mg total) under the tongue every 5 (five) minutes as needed for chest pain.   pregabalin (LYRICA) 50 MG capsule Take 50 mg by mouth at bedtime.   Turmeric 500 MG CAPS Take 2 capsules by mouth daily at 6 (six) AM.   [DISCONTINUED] atorvastatin (LIPITOR) 10 MG tablet Take 10 mg by mouth daily.     Allergies:   Bactrim [sulfamethoxazole-trimethoprim], Camphor, Ciprofloxacin, Erythromycin, and Penicillins   Social History   Socioeconomic History   Marital status: Married    Spouse name: Not  on file   Number of children: Not on file   Years of education: Not on file   Highest education level: Not on file  Occupational History   Not on file  Tobacco Use   Smoking status: Never    Passive exposure: Never   Smokeless tobacco: Never  Vaping Use   Vaping Use: Never used  Substance and Sexual Activity   Alcohol use: Yes   Drug use: Not Currently   Sexual activity: Not Currently    Birth control/protection: None  Other Topics Concern   Not on file  Social History Narrative   Not on file   Social Determinants of Health   Financial Resource Strain: Not on file  Food Insecurity: Not on file  Transportation Needs: Not on file  Physical Activity: Not on  file  Stress: Not on file  Social Connections: Not on file     Family History: The patient's family history includes Breast cancer in her cousin, paternal aunt, paternal grandmother, and sister.  ROS:   Please see the history of present illness.     All other systems reviewed and are negative.  EKGs/Labs/Other Studies Reviewed:    The following studies were reviewed today: Coronary CTA 06/2022: FINDINGS: Image quality: Good   Noise artifact is: Limited   Coronary calcium score is 235, which places the patient in the 64th percentile for age and sex matched control.   Coronary arteries: Normal coronary origins.  Right dominance.   Right Coronary Artery: Mild mixed atherosclerotic plaque in the mid RCA   Left Main Coronary Artery: Minimal mixed atherosclerotic plaque in the proximal LM, <25% stenosis.   Left Anterior Descending Coronary Artery: Moderate atherosclerotic plaque in the ostial LAD, 50-69% stenosis. There is a probable total occlusion in the distal LAD just distal to the takeoff of diagonal 2, atherosclerotic plaque with 100% stenosis. There is distal recanalization, suggesting microchannel vs collateral flow.   Left Circumflex Artery: Mild atherosclerotic plaque in the proximal LCx and proximal OM1, 25-49% stenosis.   Aorta: Normal size, 33 mm at the mid ascending aorta (level of the PA bifurcation) measured double oblique.   Aortic Valve: No calcifications.   Other findings:   Normal pulmonary vein drainage into the left atrium.   Normal left atrial appendage without thrombus.   Normal size of the pulmonary artery.   Please see separate report from [facility] for non-cardiac findings. Left breast calcifications noted, please refer to radiologist report for further characterization.   IMPRESSION: 1. Probable total occlusion of the distal LAD just after the second diagonal, 100% stenosis with distal recanalization suggesting microchannel  vs collateral flow, CADRADS 5. CT FFR will be performed and reported separately. 2. Coronary calcium score is 235, which places the patient in the 64th percentile for age and sex matched control. 3. Normal coronary origins with right dominance.   RECOMMENDATIONS: CAD-RADS 5 Total coronary occlusion (100%). Consider cardiac catheterization or viability assessment. Consider symptom-guided anti-ischemic pharmacotherapy as well as risk factor modification per guideline directed care. Cardiac Monitor 05/2022:   Patch wear time is 13 days and 21 hours   Predominant rhythm was NSR with average HR 71bpm   There were 9 runs of SVT with longest lasting 19.9seconds   Rare SVE (<1%), rare VE (<1%)   No Afib or significant pauses   Patient triggered events correlate with sinus rhythm  TTE 06/14/22: IMPRESSIONS     1. Left ventricular ejection fraction, by estimation, is 60 to 65%. The  left ventricle has normal function. The left ventricle has no regional  wall motion abnormalities. There is mild left ventricular hypertrophy.  Left ventricular diastolic parameters  are consistent with Grade I diastolic dysfunction (impaired relaxation).   2. Right ventricular systolic function is normal. The right ventricular  size is normal. Tricuspid regurgitation signal is inadequate for assessing  PA pressure.   3. The mitral valve is abnormal. Trivial mitral valve regurgitation.   4. The aortic valve is tricuspid. Aortic valve regurgitation is not  visualized.   Comparison(s): Changes from prior study are noted. 02/03/2018: LVEF 55-60%.    EKG:  EKG today personally reviewed and shows NSR with HR 68  Recent Labs: 04/09/2022: Hemoglobin 13.9; Platelets 265 07/06/2022: BUN 16; Creatinine, Ser 0.65; Potassium 4.5; Sodium 140  Recent Lipid Panel No results found for: "CHOL", "TRIG", "HDL", "CHOLHDL", "VLDL", "LDLCALC", "LDLDIRECT"   Risk Assessment/Calculations:    CHA2DS2-VASc Score = 4   This  indicates a 4.8% annual risk of stroke. The patient's score is based upon: CHF History: 0 HTN History: 0 Diabetes History: 1 Stroke History: 0 Vascular Disease History: 0 Age Score: 2 Gender Score: 1               Physical Exam:    VS:  BP 112/66   Pulse 68   Ht 5' 7"$  (1.702 m)   Wt 151 lb 9.6 oz (68.8 kg)   SpO2 98%   BMI 23.74 kg/m     Wt Readings from Last 3 Encounters:  07/12/22 151 lb 9.6 oz (68.8 kg)  07/02/22 153 lb 9.6 oz (69.7 kg)  05/14/22 152 lb 12.8 oz (69.3 kg)     GEN:  Well nourished, well developed in no acute distress HEENT: Normal NECK: No JVD; No carotid bruits CARDIAC: RRR, no murmurs, rubs, gallops RESPIRATORY:  Clear to auscultation without rales, wheezing or rhonchi  ABDOMEN: Soft, non-tender, non-distended MUSCULOSKELETAL:  No edema; No deformity  SKIN: Warm and dry NEUROLOGIC:  Alert and oriented x 3 PSYCHIATRIC:  Normal affect   ASSESSMENT:    1. Coronary artery disease of native artery of native heart with stable angina pectoris ([place])   2. Abnormal findings diagnostic imaging of heart and coronary circulation   3. Precordial pain   4. Paroxysmal atrial fibrillation (HCC)   5. Near syncope   6. Other hyperlipidemia     PLAN:    In order of problems listed above:  #CAD: Coronary CTA with moderate ostial LAD disease (FFR negative) and CTO of distal LAD. RCA and Lcx with mild disease. Given stable symptoms, will trial medical therapy at this time. If symptoms persist, can plan for cath for further evaluation. -Continue ASA 24m daily -Increase lipitor to 245mdaily -Start imdur 1513maily in the morning -Start metop 12.5mg76m in the evening -If symptoms persist despite medical therapy, will plan for cath at that time  #Paroxysmal Afib: CHADs-vasc 4. Isolated episode on home monitor with no recurrence on 2 week zio. Has been maintained on ASA. Denies any recurrence of symptoms. Declined loop today but will continue to monitor  with apple watch. -Declined loop; will continue to monitor with apple watch -[facility] to isolated, brief episode. She is monitoring as above.  #HLD: -Increase lipitor to 20mg31mly -LDL goal <55  #DMII: -Continue metformin 500mg 61my          Medication Adjustments/Labs and Tests Ordered: Current medicines are reviewed at length with the patient today.  Concerns regarding medicines are outlined above.  Orders Placed This Encounter  Procedures   EKG 12-Lead   Meds ordered this encounter  Medications   metoprolol succinate (TOPROL XL) 25 MG 24 hr tablet    Sig: Take 0.5 tablets (12.5 mg total) by mouth at bedtime.    Dispense:  45 tablet    Refill:  3   isosorbide mononitrate (IMDUR) 30 MG 24 hr tablet    Sig: Take 0.5 tablets (15 mg total) by mouth daily.    Dispense:  45 tablet    Refill:  3   atorvastatin (LIPITOR) 20 MG tablet    Sig: Take 1 tablet (20 mg total) by mouth daily.    Dispense:  90 tablet    Refill:  3    Dose increase    Patient Instructions  Medication Instructions:   START TAKING METOPROLOL SUCCINATE (TOPROL XL) 12.5 MG BY MOUTH DAILY AT BEDTIME  START TAKING ISOSORBIDE MONONITRATE (IMDUR) 15 MG BY MOUTH DAILY  INCREASE YOUR ATORVASTATIN (LIPITOR) TO 20 MG BY MOUTH DAILY  *If you need a refill on your cardiac medications before your next appointment, please call your pharmacy*     Follow-Up:  AS ALREADY SCHEDULED WITH MICHELLE SWINYER NP ON 10/04/22 AT 10:30 AM    Mediterranean Diet A Mediterranean diet refers to food and lifestyle choices that are based on the traditions of countries located on the [employer]. It focuses on eating more fruits, vegetables, whole grains, beans, nuts, seeds, and heart-healthy fats, and eating less dairy, meat, eggs, and processed foods with added sugar, salt, and fat. This way of eating has been shown to help prevent certain conditions and improve outcomes for people who have chronic diseases,  like kidney disease and heart disease. What are tips for following this plan? Reading food labels Check the serving size of packaged foods. For foods such as rice and pasta, the serving size refers to the amount of cooked product, not dry. Check the total fat in packaged foods. Avoid foods that have saturated fat or trans fats. Check the ingredient list for added sugars, such as corn syrup. Shopping  Buy a variety of foods that offer a balanced diet, including: Fresh fruits and vegetables (produce). Grains, beans, nuts, and seeds. Some of these may be available in unpackaged forms or large amounts (in bulk). Fresh seafood. Poultry and eggs. Low-fat dairy products. Buy whole ingredients instead of prepackaged foods. Buy fresh fruits and vegetables in-season from local farmers markets. Buy plain frozen fruits and vegetables. If you do not have access to quality fresh seafood, buy precooked frozen shrimp or canned fish, such as tuna, salmon, or sardines. Stock your pantry so you always have certain foods on hand, such as olive oil, canned tuna, canned tomatoes, rice, pasta, and beans. Cooking Cook foods with extra-virgin olive oil instead of using butter or other vegetable oils. Have meat as a side dish, and have vegetables or grains as your main dish. This means having meat in small portions or adding small amounts of meat to foods like pasta or stew. Use beans or vegetables instead of meat in common dishes like chili or lasagna. Experiment with different cooking methods. Try roasting, broiling, steaming, and sauting vegetables. Add frozen vegetables to soups, stews, pasta, or rice. Add nuts or seeds for added healthy fats and plant protein at each meal. You can add these to yogurt, salads, or vegetable dishes. Marinate fish or vegetables using olive oil, lemon juice, garlic, and fresh  herbs. Meal planning Plan to eat one vegetarian meal one day each week. Try to work up to two vegetarian  meals, if possible. Eat seafood two or more times a week. Have healthy snacks readily available, such as: Vegetable sticks with hummus. Greek yogurt. Fruit and nut trail mix. Eat balanced meals throughout the week. This includes: Fruit: 2-3 servings a day. Vegetables: 4-5 servings a day. Low-fat dairy: 2 servings a day. Fish, poultry, or lean meat: 1 serving a day. Beans and legumes: 2 or more servings a week. Nuts and seeds: 1-2 servings a day. Whole grains: 6-8 servings a day. Extra-virgin olive oil: 3-4 servings a day. Limit red meat and sweets to only a few servings a month. Lifestyle  Cook and eat meals together with your family, when possible. Drink enough fluid to keep your urine pale yellow. Be physically active every day. This includes: Aerobic exercise like running or swimming. Leisure activities like gardening, walking, or housework. Get 7-8 hours of sleep each night. If recommended by your health care provider, drink red wine in moderation. This means 1 glass a day for nonpregnant women and 2 glasses a day for men. A glass of wine equals 5 oz (150 mL). What foods should I eat? Fruits Apples. Apricots. Avocado. Berries. Bananas. Cherries. Dates. Figs. Grapes. Lemons. Melon. Oranges. Peaches. Plums. Pomegranate. Vegetables Artichokes. Beets. Broccoli. Cabbage. Carrots. Eggplant. Green beans. Chard. Kale. Spinach. Onions. Leeks. Peas. Squash. Tomatoes. Peppers. Radishes. Grains Whole-grain pasta. Brown rice. Bulgur wheat. Polenta. Couscous. Whole-wheat bread. [place]. Meats and other proteins Beans. Almonds. Sunflower seeds. Pine nuts. Peanuts. [place]. Salmon. Scallops. Shrimp. [place]. Tilapia. Clams. Oysters. Eggs. Poultry without skin. Dairy Low-fat milk. Cheese. Greek yogurt. Fats and oils Extra-virgin olive oil. Avocado oil. Grapeseed oil. Beverages Water. Red wine. Herbal tea. Sweets and desserts Greek yogurt with honey. Baked apples. Poached pears. Trail  mix. Seasonings and condiments Basil. Cilantro. Coriander. Cumin. Mint. Parsley. Sage. Rosemary. Tarragon. Garlic. Oregano. Thyme. Pepper. Balsamic vinegar. Tahini. Hummus. Tomato sauce. Olives. Mushrooms. The items listed above may not be a complete list of foods and beverages you can eat. Contact a dietitian for more information. What foods should I limit? This is a list of foods that should be eaten rarely or only on special occasions. Fruits Fruit canned in syrup. Vegetables Deep-fried potatoes (french fries). Grains Prepackaged pasta or rice dishes. Prepackaged cereal with added sugar. Prepackaged snacks with added sugar. Meats and other proteins Beef. Pork. Lamb. Poultry with skin. Hot dogs. Berniece Salines. Dairy Ice cream. Sour cream. Whole milk. Fats and oils Butter. Canola oil. Vegetable oil. Beef fat (tallow). Lard. Beverages Juice. Sugar-sweetened soft drinks. Beer. Liquor and spirits. Sweets and desserts Cookies. Cakes. Pies. Candy. Seasonings and condiments Mayonnaise. Pre-made sauces and marinades. The items listed above may not be a complete list of foods and beverages you should limit. Contact a dietitian for more information. Summary The Mediterranean diet includes both food and lifestyle choices. Eat a variety of fresh fruits and vegetables, beans, nuts, seeds, and whole grains. Limit the amount of red meat and sweets that you eat. If recommended by your health care provider, drink red wine in moderation. This means 1 glass a day for nonpregnant women and 2 glasses a day for men. A glass of wine equals 5 oz (150 mL). This information is not intended to replace advice given to you by your health care provider. Make sure you discuss any questions you have with your health care provider. Document Revised: 06/19/2019 Document Reviewed:  04/16/2019 Elsevier Patient Education  [place], Freada Bergeron, MD  07/12/2022 12:30 PM    [place]

## 2022-07-12 NOTE — Patient Instructions (Addendum)
Medication Instructions:   START TAKING METOPROLOL SUCCINATE (TOPROL XL) 12.5 MG BY MOUTH DAILY AT BEDTIME  START TAKING ISOSORBIDE MONONITRATE (IMDUR) 15 MG BY MOUTH DAILY  INCREASE YOUR ATORVASTATIN (LIPITOR) TO 20 MG BY MOUTH DAILY  *If you need a refill on your cardiac medications before your next appointment, please call your pharmacy*     Follow-Up:  AS ALREADY SCHEDULED WITH MICHELLE SWINYER NP ON 10/04/22 AT 10:30 AM    Mediterranean Diet A Mediterranean diet refers to food and lifestyle choices that are based on the traditions of countries located on the [employer]. It focuses on eating more fruits, vegetables, whole grains, beans, nuts, seeds, and heart-healthy fats, and eating less dairy, meat, eggs, and processed foods with added sugar, salt, and fat. This way of eating has been shown to help prevent certain conditions and improve outcomes for people who have chronic diseases, like kidney disease and heart disease. What are tips for following this plan? Reading food labels Check the serving size of packaged foods. For foods such as rice and pasta, the serving size refers to the amount of cooked product, not dry. Check the total fat in packaged foods. Avoid foods that have saturated fat or trans fats. Check the ingredient list for added sugars, such as corn syrup. Shopping  Buy a variety of foods that offer a balanced diet, including: Fresh fruits and vegetables (produce). Grains, beans, nuts, and seeds. Some of these may be available in unpackaged forms or large amounts (in bulk). Fresh seafood. Poultry and eggs. Low-fat dairy products. Buy whole ingredients instead of prepackaged foods. Buy fresh fruits and vegetables in-season from local farmers markets. Buy plain frozen fruits and vegetables. If you do not have access to quality fresh seafood, buy precooked frozen shrimp or canned fish, such as tuna, salmon, or sardines. Stock your pantry so you always have  certain foods on hand, such as olive oil, canned tuna, canned tomatoes, rice, pasta, and beans. Cooking Cook foods with extra-virgin olive oil instead of using butter or other vegetable oils. Have meat as a side dish, and have vegetables or grains as your main dish. This means having meat in small portions or adding small amounts of meat to foods like pasta or stew. Use beans or vegetables instead of meat in common dishes like chili or lasagna. Experiment with different cooking methods. Try roasting, broiling, steaming, and sauting vegetables. Add frozen vegetables to soups, stews, pasta, or rice. Add nuts or seeds for added healthy fats and plant protein at each meal. You can add these to yogurt, salads, or vegetable dishes. Marinate fish or vegetables using olive oil, lemon juice, garlic, and fresh herbs. Meal planning Plan to eat one vegetarian meal one day each week. Try to work up to two vegetarian meals, if possible. Eat seafood two or more times a week. Have healthy snacks readily available, such as: Vegetable sticks with hummus. Greek yogurt. Fruit and nut trail mix. Eat balanced meals throughout the week. This includes: Fruit: 2-3 servings a day. Vegetables: 4-5 servings a day. Low-fat dairy: 2 servings a day. Fish, poultry, or lean meat: 1 serving a day. Beans and legumes: 2 or more servings a week. Nuts and seeds: 1-2 servings a day. Whole grains: 6-8 servings a day. Extra-virgin olive oil: 3-4 servings a day. Limit red meat and sweets to only a few servings a month. Lifestyle  Cook and eat meals together with your family, when possible. Drink enough fluid to keep your urine pale  yellow. Be physically active every day. This includes: Aerobic exercise like running or swimming. Leisure activities like gardening, walking, or housework. Get 7-8 hours of sleep each night. If recommended by your health care provider, drink red wine in moderation. This means 1 glass a day for  nonpregnant women and 2 glasses a day for men. A glass of wine equals 5 oz (150 mL). What foods should I eat? Fruits Apples. Apricots. Avocado. Berries. Bananas. Cherries. Dates. Figs. Grapes. Lemons. Melon. Oranges. Peaches. Plums. Pomegranate. Vegetables Artichokes. Beets. Broccoli. Cabbage. Carrots. Eggplant. Green beans. Chard. Kale. Spinach. Onions. Leeks. Peas. Squash. Tomatoes. Peppers. Radishes. Grains Whole-grain pasta. Brown rice. Bulgur wheat. Polenta. Couscous. Whole-wheat bread. [place]. Meats and other proteins Beans. Almonds. Sunflower seeds. Pine nuts. Peanuts. [place]. Salmon. Scallops. Shrimp. [place]. Tilapia. Clams. Oysters. Eggs. Poultry without skin. Dairy Low-fat milk. Cheese. Greek yogurt. Fats and oils Extra-virgin olive oil. Avocado oil. Grapeseed oil. Beverages Water. Red wine. Herbal tea. Sweets and desserts Greek yogurt with honey. Baked apples. Poached pears. Trail mix. Seasonings and condiments Basil. Cilantro. Coriander. Cumin. Mint. Parsley. Sage. Rosemary. Tarragon. Garlic. Oregano. Thyme. Pepper. Balsamic vinegar. Tahini. Hummus. Tomato sauce. Olives. Mushrooms. The items listed above may not be a complete list of foods and beverages you can eat. Contact a dietitian for more information. What foods should I limit? This is a list of foods that should be eaten rarely or only on special occasions. Fruits Fruit canned in syrup. Vegetables Deep-fried potatoes (french fries). Grains Prepackaged pasta or rice dishes. Prepackaged cereal with added sugar. Prepackaged snacks with added sugar. Meats and other proteins Beef. Pork. Lamb. Poultry with skin. Hot dogs. Berniece Salines. Dairy Ice cream. Sour cream. Whole milk. Fats and oils Butter. Canola oil. Vegetable oil. Beef fat (tallow). Lard. Beverages Juice. Sugar-sweetened soft drinks. Beer. Liquor and spirits. Sweets and desserts Cookies. Cakes. Pies. Candy. Seasonings and condiments Mayonnaise. Pre-made  sauces and marinades. The items listed above may not be a complete list of foods and beverages you should limit. Contact a dietitian for more information. Summary The Mediterranean diet includes both food and lifestyle choices. Eat a variety of fresh fruits and vegetables, beans, nuts, seeds, and whole grains. Limit the amount of red meat and sweets that you eat. If recommended by your health care provider, drink red wine in moderation. This means 1 glass a day for nonpregnant women and 2 glasses a day for men. A glass of wine equals 5 oz (150 mL). This information is not intended to replace advice given to you by your health care provider. Make sure you discuss any questions you have with your health care provider. Document Revised: 06/19/2019 Document Reviewed: 04/16/2019 Elsevier Patient Education  [place] [facility].

## 2022-07-27 ENCOUNTER — Encounter: Payer: Self-pay | Admitting: Cardiology

## 2022-08-23 ENCOUNTER — Encounter: Payer: Self-pay | Admitting: Cardiology

## 2022-09-09 NOTE — Progress Notes (Unsigned)
Cardiology Office Note:    Date:  09/11/2022   ID:  Alison Fields, DOB [birth year], MRN [id number]  PCP:  Alison Montana, MD   [facility] Providers Cardiologist:  Alison Sprague, MD     Referring MD: Alison Montana, MD   Chief Complaint: follow-up CAD  History of Present Illness:    Alison Fields is a very pleasant 82 y.o. female with a hx of CAD, HLD, type 2 diabetes, and breast cancer.   Seen by Dr. Katrinka Fields 04/2022 for episodes of palpitations for which she was seen in the ER and found to have atrial fibrillation.  She was started on aspirin by Dr. Katrinka Fields and a 2-week monitor was placed.  Cardiac monitor 06/05/2022 showed no A-fib.  TTE 06/14/2022 with LVEF 60 to 65%, G1 DD, normal RV, trivial MR.  Seen in cardiology clinic by Dr. Shari Fields on 07/02/2022 where she was complaining of chest pain on exertion that was relieved with rest.  Coronary CTA revealed likely CTO of the distal LAD with ostial 50 to 69% stenosis.  RCA and LCx with mild disease.  Seen in follow-up by Dr. Shari Fields on 07/12/2022.  CTA results were discussed at length and the goal to trial medical therapy versus pursue cardiac catheterization.  She was advised to continue aspirin 81 mg, increase atorvastatin to 20 mg daily, start Imdur 15 mg daily in the morning, and to start metoprolol succinate 12.5 mg in the evening. No further episodes of PAF. [name] She is not on AC.  Today, she is here with her husband for follow-up. Reports one episode of significant symptoms consistent with orthostatic hypotension occurred soon after starting Imdur and metoprolol.  This has not occurred again. She takes Imdur 15 mg every morning and metoprolol 12.5 mg in the evenings. Has been monitoring BP and HR at home 2 hours after taking medications. Also monitors how far she walks daily and at what pace is well as symptoms. Typically walks 1.5 miles in under 29 minutes. Admits she stops frequently to drink  water and occasionally has chest pressure with walking.  Has not taken sublingual nitroglycerin.  Husband says he can tell how she is feeling based on how many times she stops. I reviewed her log. At times she is walking at a faster pace without symptoms. 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 Typically does not experience symptoms at those times. Occasionally walks without any pressure.  Does not have nausea, vomiting, palpitations, shortness of breath, or diaphoresis associated with pressure. Has walked for exercise for many years and has gradually decreased distance as they have gotten older. Also monitoring for irregular heart rate on Apple watch. Watch has reported < 2% occurrence. Will attempt to send a tracing.    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 Swallow whole.   atorvastatin (LIPITOR) 20 MG tablet Take 1 tablet (20 mg  total) by mouth daily.   Cranberry 405 MG CAPS Take by mouth.   famotidine (PEPCID) 20 MG tablet Take 20 mg by mouth daily.   fexofenadine (ALLEGRA) 180 MG tablet Take 180 mg by mouth daily.   isosorbide mononitrate (IMDUR) 30 MG 24 hr tablet Take 0.5 tablets (15 mg total) by mouth daily.   metFORMIN (GLUCOPHAGE-XR) 500 MG 24 hr tablet Take 500 mg by mouth daily with breakfast.   metoprolol succinate (TOPROL XL) 25 MG 24 hr tablet Take 0.5 tablets (12.5 mg total) by mouth at bedtime.   metroNIDAZOLE (METROCREAM) 0.75 % cream Apply topically 2 (two) times daily.   Multiple Vitamin  (MULTIVITAMINS PO) Take by mouth.   multivitamin-lutein (OCUVITE-LUTEIN) CAPS capsule Take 1 capsule by mouth daily.   nitroGLYCERIN (NITROSTAT) 0.4 MG SL tablet Place 1 tablet (0.4 mg total) under the tongue every 5 (five) minutes as needed for chest pain.   pregabalin (LYRICA) 50 MG capsule Take 50 mg by mouth at bedtime.   Turmeric 500 MG CAPS Take 2 capsules by mouth daily at 6 (six) AM.     Allergies:   Bactrim [sulfamethoxazole-trimethoprim], Camphor, Ciprofloxacin, Erythromycin, and Penicillins   Social History   Socioeconomic History   Marital status: Married    Spouse name: Not on file   Number of children: Not on file   Years of education: Not on file   Highest education level: Not on file  Occupational History   Not on file  Tobacco Use   Smoking status: Never    Passive exposure: Never   Smokeless tobacco: Never  Vaping Use   Vaping Use: Never used  Substance and Sexual Activity   Alcohol use: Yes   Drug use: Not Currently   Sexual activity: Not Currently    Birth control/protection: None  Other Topics Concern   Not on file  Social History Narrative   Not on file   Social Determinants of Health   Financial Resource Strain: Not on file  Food Insecurity: Not on file  Transportation Needs: Not on file  Physical Activity: Not on file  Stress: Not on file  Social Connections: Not on file     Family History: The patient's family history includes Breast cancer in her cousin, paternal aunt, paternal grandmother, and sister.  ROS:   Please see the history of present illness.    + occasional chest pressure All other systems reviewed and are negative.  Labs/Other Studies Reviewed:    The following studies were reviewed today:  Cardiac Studies & Procedures       ECHOCARDIOGRAM  ECHOCARDIOGRAM COMPLETE 06/14/2022  Narrative ECHOCARDIOGRAM REPORT  Indications:    I48.0 Paroxysmal atrial fibrillation  History:        Patient has prior history of  Echocardiogram examinations, most recent 02/03/2018. Risk Factors:Diabetes and Dyslipidemia. Palpitations. Breast cancer. Trigeminal neuralgia.  Sonographer:    Cathie Beams RCS Referring Phys: [phone] [facility] Dienes [facility]  IMPRESSIONS   1. Left ventricular ejection fraction, by estimation, is 60 to 65%. The left ventricle has normal function. The left ventricle has no regional wall motion abnormalities. There is mild left ventricular hypertrophy. Left ventricular diastolic parameters are consistent with Grade I diastolic dysfunction (impaired relaxation). 2. Right ventricular systolic function is normal. The right ventricular size is normal. Tricuspid regurgitation signal is inadequate for assessing PA pressure. 3. The mitral valve is abnormal. Trivial mitral valve regurgitation. 4. The aortic valve is tricuspid. Aortic valve regurgitation is not visualized.  Comparison(s): Changes from  prior study are noted. 02/03/2018: LVEF 55-60%.  FINDINGS Left Ventricle: Left ventricular ejection fraction, by estimation, is 60 to 65%. The left ventricle has normal function. The left ventricle has no regional wall motion abnormalities. The left ventricular internal cavity size was normal in size. There is mild left ventricular hypertrophy. Left ventricular diastolic parameters are consistent with Grade I diastolic dysfunction (impaired relaxation). Indeterminate filling pressures.  Right Ventricle: The right ventricular size is normal. No increase in right ventricular wall thickness. Right ventricular systolic function is normal. Tricuspid regurgitation signal is inadequate for assessing PA pressure.  Left Atrium: Left atrial size was normal in size.  Right Atrium: Right atrial size was normal in size.  Pericardium: There is no evidence of pericardial effusion.  Mitral Valve: The mitral valve is abnormal. There is moderate thickening of the anterior and posterior mitral valve leaflet(s). Trivial mitral valve  regurgitation.  Tricuspid Valve: The tricuspid valve is grossly normal. Tricuspid valve regurgitation is trivial.  Aortic Valve: The aortic valve is tricuspid. Aortic valve regurgitation is not visualized.  Pulmonic Valve: The pulmonic valve was normal in structure. Pulmonic valve regurgitation is not visualized.  Aorta: The aortic root and ascending aorta are structurally normal, with no evidence of dilitation.  Venous: The inferior vena cava was not well visualized.  IAS/Shunts: No atrial level shunt detected by color flow Doppler.      MONITORS  LONG TERM MONITOR (3-14 DAYS) 06/08/2022  Narrative   Patch wear time is 13 days and 21 hours   Predominant rhythm was NSR with average HR 71bpm   There were 9 runs of SVT with longest lasting 19.9seconds   Rare SVE (<1%), rare VE (<1%)   No Afib or significant pauses   Patient triggered events correlate with sinus rhythm   Patch Wear Time:  13 days and 21 hours (2023-12-19T22:51:19-0500 to 2024-01-02T20:03:55-0500)  Patient had a min HR of 47 bpm, max HR of 148 bpm, and avg HR of 71 bpm. Predominant underlying rhythm was Sinus Rhythm. 9 Supraventricular Tachycardia runs occurred, the run with the fastest interval lasting 6 beats with a max rate of 148 bpm, the longest lasting 19.9 secs with an avg rate of 100 bpm. Isolated SVEs were rare (<1.0%), SVE Couplets were rare (<1.0%), and SVE Triplets were rare (<1.0%). Isolated VEs were rare (<1.0%), and no VE Couplets or VE Triplets were present.  Laurance Flatten, MD   CT SCANS  CT CORONARY MORPH W/CTA COR W/SCORE 07/10/2022  Addendum 07/10/2022  8:58 PM ADDENDUM REPORT: 07/10/2022 20:55  EXAM: OVER-READ INTERPRETATION  CT CHEST  The following report is an over-read performed by radiologist Dr. Carola Frost [facility] on 07/10/2022. This over-read does not include interpretation of cardiac or coronary anatomy or pathology. The coronary CTA interpretation by the  cardiologist is attached.  COMPARISON:  None.  FINDINGS: There is no evidence for mediastinal adenopathy. The visualized esophagus is within normal limits. Heart appears mildly enlarged. There is no pericardial effusion. There are atelectatic changes in both lung bases. The lungs are otherwise clear. There is a calcified left hilar lymph node compatible with old granulomatous disease. Visualized upper abdomen is unremarkable. Peripherally calcified area of fat noted in the left breast measuring 2.4 x 2.0 likely related to fat necrosis. There is adjacent scarring and calcification in the left breast. There is no evidence for acute fracture or focal osseous lesion.  IMPRESSION: 1. Calcified left hilar lymph node compatible with old granulomatous disease. 2. Peripherally calcified area  of fat in the left breast likely related to fat necrosis. There is adjacent scarring and calcification in the left breast.   Electronically Signed By: Darliss Cheney M.D. On: 07/10/2022 20:55  Narrative HISTORY: Chest pain, nonspecific  EXAM: Cardiac/Coronary  CT  TECHNIQUE: The patient was scanned on a [employer].  PROTOCOL: A 120 kV prospective scan was triggered in the descending thoracic aorta at 111 HU's. Axial non-contrast 3 mm slices were carried out through the heart. The data set was analyzed on a dedicated work station and scored using the Agatston method. Gantry rotation speed was 250 msecs and collimation was .6 mm. Beta blockade and 0.8 mg of sl NTG was given. The 3D data set was reconstructed in 5% intervals of the 35-75 % of the R-R cycle. Systolic and diastolic phases were analyzed on a dedicated work station using MPR, MIP and VRT modes. The patient received contrast: 95mL OMNIPAQUE IOHEXOL 350 MG/ML SOLN.  FINDINGS: Image quality: Good  Noise artifact is: Limited  Coronary calcium score is 235, which places the patient in the 64th percentile for age and  sex matched control.  Coronary arteries: Normal coronary origins.  Right dominance.  Right Coronary Artery: Mild mixed atherosclerotic plaque in the mid RCA  Left Main Coronary Artery: Minimal mixed atherosclerotic plaque in the proximal LM, <25% stenosis.  Left Anterior Descending Coronary Artery: Moderate atherosclerotic plaque in the ostial LAD, 50-69% stenosis. There is a probable total occlusion in the distal LAD just distal to the takeoff of diagonal 2, atherosclerotic plaque with 100% stenosis. There is distal recanalization, suggesting microchannel vs collateral flow.  Left Circumflex Artery: Mild atherosclerotic plaque in the proximal LCx and proximal OM1, 25-49% stenosis.  Aorta: Normal size, 33 mm at the mid ascending aorta (level of the PA bifurcation) measured double oblique.  Aortic Valve: No calcifications.  Other findings:  Normal pulmonary vein drainage into the left atrium.  Normal left atrial appendage without thrombus.  Normal size of the pulmonary artery.  Please see separate report from [facility] for non-cardiac findings. Left breast calcifications noted, please refer to radiologist report for further characterization.  IMPRESSION: 1. Probable total occlusion of the distal LAD just after the second diagonal, 100% stenosis with distal recanalization suggesting microchannel vs collateral flow, CADRADS 5. CT FFR will be performed and reported separately. 2. Coronary calcium score is 235, which places the patient in the 64th percentile for age and sex matched control. 3. Normal coronary origins with right dominance.  RECOMMENDATIONS: CAD-RADS 5 Total coronary occlusion (100%). Consider cardiac catheterization or viability assessment. Consider symptom-guided anti-ischemic pharmacotherapy as well as risk factor modification per guideline directed care.  Electronically Signed: By: Weston Brass M.D. On: 07/09/2022 13:54            Recent Labs: 04/09/2022: Hemoglobin 13.9; Platelets 265 07/06/2022: BUN 16; Creatinine, Ser 0.65; Potassium 4.5; Sodium 140  Recent Lipid Panel No results found for: "CHOL", "TRIG", "HDL", "CHOLHDL", "VLDL", "LDLCALC", "LDLDIRECT"   Risk Assessment/Calculations:    CHA2DS2-VASc Score = 4   This indicates a 4.8% annual risk of stroke. The patient's score is based upon: CHF History: 0 HTN History: 0 Diabetes History: 1 Stroke History: 0 Vascular Disease History: 0 Age Score: 2 Gender Score: 1          Physical Exam:    VS:  BP 124/68   Pulse 64   Ht  (1.702 m)   Wt 154 lb 3.2 oz (69.9 kg)  SpO2 98%   BMI 24.15 kg/m     Wt Readings from Last 3 Encounters:  09/11/22 154 lb 3.2 oz (69.9 kg)  07/12/22 151 lb 9.6 oz (68.8 kg)  07/02/22 153 lb 9.6 oz (69.7 kg)     GEN:  Well nourished, well developed in no acute distress HEENT: Normal NECK: No JVD; No carotid bruits CARDIAC: RRR, no murmurs, rubs, gallops RESPIRATORY:  Clear to auscultation without rales, wheezing or rhonchi  ABDOMEN: Soft, non-tender, non-distended MUSCULOSKELETAL:  No edema; No deformity. 2+ pedal pulses, equal bilaterally SKIN: Warm and dry NEUROLOGIC:  Alert and oriented x 3 PSYCHIATRIC:  Normal affect   EKG:  EKG is not  ordered today   Diagnoses:    1. Coronary artery disease of native artery of native heart with stable angina pectoris   2. Paroxysmal atrial fibrillation   3. Hyperlipidemia LDL goal <70    Assessment and Plan:     CAD with stable angina: Coronary CTA with moderate ostial LAD disease (FFR negative) and CTO of distal LAD.  RCA and LCx with mild disease. Plan for medical therapy. Started Imdur and metoprolol 07/12/22 and has carefully monitored BP, HR, and symptoms carefully since that time. Has soft BP on occasion. Only one episode of presyncope soon after starting medication. Lengthy discussion and review of her records. She is maintaining consistent walking speed,  stopping on occasion, with mild chest pressure on most days. It has not worsened and on rare occasions does not occur at all. No use of sl ntg. No alarming symptoms that caused her to consider calling 911. With soft BP, do not feel we can up-titrate anti-anginal therapy. ER precautions advised.  Advised her to notify us if symptoms worsen prior to next office visit.  Will see her back in 3 months.  No bleeding concerns. Continue Imdur, metoprolol, atorvastatin, aspirin.  Hyperlipidemia LDL goal < 55: LDL 57 on 10/06/2021. Atorvastatin was doubled by Dr. Shari Fields in the setting of stabilization of plaque identified on CCTA. We will recheck lipid panel tomorrow when she can return fasting.   PAF: Isolated episode on home monitor 04/2022 with no recurrence on 2-week Zio patch. She declined loop monitor. Monitoring on Apple Watch, has seen < 2% a fib reported. Not currently on [facility] for CHA2DS2-VASc score of 4. Encouraged her to send tracing from Apple watch when able.      Disposition: 3 months with Dr. Shari Fields   Medication Adjustments/Labs and Tests Ordered: Current medicines are reviewed at length with the patient today.  Concerns regarding medicines are outlined above.  Orders Placed This Encounter  Procedures   Lipid Profile   Comp Met (CMET)   No orders of the defined types were placed in this encounter.   Patient Instructions  Medication Instructions:   Your physician recommends that you continue on your current medications as directed. Please refer to the Current Medication list given to you today.   *If you need a refill on your cardiac medications before your next appointment, please call your pharmacy*   Lab Work:  Your physician recommends that you return for a FASTING lipid profile/cmet on Wednesday, April 17. You can come in on the day of your appointment anytime between 7:30-4:30 fasting from midnight the night before.     If you have labs (blood work) drawn today and  your tests are completely normal, you will receive your results only by: MyChart Message (if you have MyChart) OR A paper copy in the mail  If you have any lab test that is abnormal or we need to change your treatment, we will call you to review the results.   Testing/Procedures:  None ordered.   Follow-Up: At [facility], you and your health needs are our priority.  As part of our continuing mission to provide you with exceptional heart care, we have created designated Provider Care Teams.  These Care Teams include your primary Cardiologist (physician) and Advanced Practice Providers (APPs -  Physician Assistants and Nurse Practitioners) who all work together to provide you with the care you need, when you need it.  We recommend signing up for the patient portal called "MyChart".  Sign up information is provided on this After Visit Summary.  MyChart is used to connect with patients for Virtual Visits (Telemedicine).  Patients are able to view lab/test results, encounter notes, upcoming appointments, etc.  Non-urgent messages can be sent to your provider as well.   To learn more about what you can do with MyChart, go to [email or web address].    Your next appointment:   3 month(s)  Provider:   Meriam Sprague, MD       Signed, Levi Aland, NP  09/11/2022 4:28 PM    [facility]

## 2022-09-11 ENCOUNTER — Ambulatory Visit: Payer: Medicare Other | Attending: Nurse Practitioner | Admitting: Nurse Practitioner

## 2022-09-11 ENCOUNTER — Encounter: Payer: Self-pay | Admitting: Nurse Practitioner

## 2022-09-11 VITALS — BP 124/68 | HR 64 | Ht 67.0 in | Wt 154.2 lb

## 2022-09-11 DIAGNOSIS — I25118 Atherosclerotic heart disease of native coronary artery with other forms of angina pectoris: Secondary | ICD-10-CM

## 2022-09-11 DIAGNOSIS — I480 Paroxysmal atrial fibrillation: Secondary | ICD-10-CM | POA: Diagnosis not present

## 2022-09-11 DIAGNOSIS — E785 Hyperlipidemia, unspecified: Secondary | ICD-10-CM | POA: Diagnosis not present

## 2022-09-11 NOTE — Patient Instructions (Signed)
Medication Instructions:   Your physician recommends that you continue on your current medications as directed. Please refer to the Current Medication list given to you today.   *If you need a refill on your cardiac medications before your next appointment, please call your pharmacy*   Lab Work:  Your physician recommends that you return for a FASTING lipid profile/cmet on Wednesday, April 17. You can come in on the day of your appointment anytime between 7:30-4:30 fasting from midnight the night before.     If you have labs (blood work) drawn today and your tests are completely normal, you will receive your results only by: MyChart Message (if you have MyChart) OR A paper copy in the mail If you have any lab test that is abnormal or we need to change your treatment, we will call you to review the results.   Testing/Procedures:  None ordered.   Follow-Up: At [facility], you and your health needs are our priority.  As part of our continuing mission to provide you with exceptional heart care, we have created designated Provider Care Teams.  These Care Teams include your primary Cardiologist (physician) and Advanced Practice Providers (APPs -  Physician Assistants and Nurse Practitioners) who all work together to provide you with the care you need, when you need it.  We recommend signing up for the patient portal called "MyChart".  Sign up information is provided on this After Visit Summary.  MyChart is used to connect with patients for Virtual Visits (Telemedicine).  Patients are able to view lab/test results, encounter notes, upcoming appointments, etc.  Non-urgent messages can be sent to your provider as well.   To learn more about what you can do with MyChart, go to [email or web address].    Your next appointment:   3 month(s)  Provider:   Meriam Sprague, MD

## 2022-09-12 ENCOUNTER — Ambulatory Visit: Payer: Medicare Other | Attending: Nurse Practitioner

## 2022-09-12 DIAGNOSIS — I480 Paroxysmal atrial fibrillation: Secondary | ICD-10-CM

## 2022-09-12 DIAGNOSIS — I25118 Atherosclerotic heart disease of native coronary artery with other forms of angina pectoris: Secondary | ICD-10-CM

## 2022-09-12 DIAGNOSIS — E785 Hyperlipidemia, unspecified: Secondary | ICD-10-CM

## 2022-09-13 LAB — COMPREHENSIVE METABOLIC PANEL
ALT: 16 IU/L (ref 0–32)
AST: 20 IU/L (ref 0–40)
Albumin/Globulin Ratio: 1.8 (ref 1.2–2.2)
Albumin: 4.4 g/dL (ref 3.7–4.7)
Alkaline Phosphatase: 53 IU/L (ref 44–121)
BUN/Creatinine Ratio: 16 (ref 12–28)
BUN: 12 mg/dL (ref 8–27)
Bilirubin Total: 0.5 mg/dL (ref 0.0–1.2)
CO2: 25 mmol/L (ref 20–29)
Calcium: 9.9 mg/dL (ref 8.7–10.3)
Chloride: 102 mmol/L (ref 96–106)
Creatinine, Ser: 0.74 mg/dL (ref 0.57–1.00)
Globulin, Total: 2.4 g/dL (ref 1.5–4.5)
Glucose: 109 mg/dL — ABNORMAL HIGH (ref 70–99)
Potassium: 4.9 mmol/L (ref 3.5–5.2)
Sodium: 143 mmol/L (ref 134–144)
Total Protein: 6.8 g/dL (ref 6.0–8.5)
eGFR: 81 mL/min/{1.73_m2} (ref >59)

## 2022-09-13 LAB — LIPID PANEL
Chol/HDL Ratio: 2.1 ratio (ref 0.0–4.4)
Cholesterol, Total: 128 mg/dL (ref 100–199)
HDL: 60 mg/dL (ref >39)
LDL Chol Calc (NIH): 50 mg/dL (ref 0–99)
Triglycerides: 97 mg/dL (ref 0–149)
VLDL Cholesterol Cal: 18 mg/dL (ref 5–40)

## 2022-10-04 ENCOUNTER — Ambulatory Visit: Payer: Medicare Other | Admitting: Nurse Practitioner

## 2022-10-17 DIAGNOSIS — I480 Paroxysmal atrial fibrillation: Secondary | ICD-10-CM | POA: Diagnosis not present

## 2022-10-17 DIAGNOSIS — Z23 Encounter for immunization: Secondary | ICD-10-CM | POA: Diagnosis not present

## 2022-10-17 DIAGNOSIS — E1142 Type 2 diabetes mellitus with diabetic polyneuropathy: Secondary | ICD-10-CM | POA: Diagnosis not present

## 2022-10-17 DIAGNOSIS — Z0000 Encounter for general adult medical examination without abnormal findings: Secondary | ICD-10-CM | POA: Diagnosis not present

## 2022-10-17 DIAGNOSIS — M8588 Other specified disorders of bone density and structure, other site: Secondary | ICD-10-CM | POA: Diagnosis not present

## 2022-10-17 DIAGNOSIS — D6869 Other thrombophilia: Secondary | ICD-10-CM | POA: Diagnosis not present

## 2022-10-17 DIAGNOSIS — E1169 Type 2 diabetes mellitus with other specified complication: Secondary | ICD-10-CM | POA: Diagnosis not present

## 2022-10-17 DIAGNOSIS — E041 Nontoxic single thyroid nodule: Secondary | ICD-10-CM | POA: Diagnosis not present

## 2022-10-17 DIAGNOSIS — I25118 Atherosclerotic heart disease of native coronary artery with other forms of angina pectoris: Secondary | ICD-10-CM | POA: Diagnosis not present

## 2022-10-17 DIAGNOSIS — G500 Trigeminal neuralgia: Secondary | ICD-10-CM | POA: Diagnosis not present

## 2022-10-17 DIAGNOSIS — K219 Gastro-esophageal reflux disease without esophagitis: Secondary | ICD-10-CM | POA: Diagnosis not present

## 2022-11-26 DIAGNOSIS — U071 COVID-19: Secondary | ICD-10-CM | POA: Diagnosis not present

## 2022-12-03 ENCOUNTER — Ambulatory Visit: Payer: Medicare Other | Admitting: Cardiology

## 2022-12-06 DIAGNOSIS — L812 Freckles: Secondary | ICD-10-CM | POA: Diagnosis not present

## 2022-12-06 DIAGNOSIS — D2261 Melanocytic nevi of right upper limb, including shoulder: Secondary | ICD-10-CM | POA: Diagnosis not present

## 2022-12-06 DIAGNOSIS — L718 Other rosacea: Secondary | ICD-10-CM | POA: Diagnosis not present

## 2022-12-06 DIAGNOSIS — L218 Other seborrheic dermatitis: Secondary | ICD-10-CM | POA: Diagnosis not present

## 2022-12-06 DIAGNOSIS — B078 Other viral warts: Secondary | ICD-10-CM | POA: Diagnosis not present

## 2022-12-06 DIAGNOSIS — L821 Other seborrheic keratosis: Secondary | ICD-10-CM | POA: Diagnosis not present

## 2022-12-06 DIAGNOSIS — D1801 Hemangioma of skin and subcutaneous tissue: Secondary | ICD-10-CM | POA: Diagnosis not present

## 2022-12-06 DIAGNOSIS — D225 Melanocytic nevi of trunk: Secondary | ICD-10-CM | POA: Diagnosis not present

## 2022-12-06 NOTE — Progress Notes (Signed)
Cardiology Office Note:    Date:  12/17/2022   ID:  Alison Fields, DOB December 14, 1940, MRN [id number]  PCP:  Laurann Montana, MD   [facility] Providers Cardiologist:  Meriam Sprague, MD    Referring MD: Laurann Montana, MD    History of Present Illness:    Alison Fields is a 82 y.o. female with a hx of HLD, DMII, and history of breast cancer who presents to clinic for follow-up.  Was seen by Dr. Katrinka Blazing in 04/2022 for episodes of palpitations for which she was seen in the ER found to be in Afib at that time. She was started on ASA by Dr. Katrinka Blazing and a 2 week monitor was placed. Cardiac monitor showed 06/05/22 which showed no Afib. TTE 06/14/22 with LVEF 60-65%, G1DD, normal RV, trivial MR.  Was seen in clinic in 07/02/22 where she was complaining of chest pain on exertion that was relieved with rest. Coronary CTA revealed likely CTO of the distal LAD with ostial 50-69% stenosis. RCA and Lcx with mild disease. Was managed medically with imdur and metop  Was last seen in clinic on 08/2022 by Eligha Bridegroom where she was doing well. Remained active with one episode of orthostasis following initiation of metop/imdur but this improved.  Today, the patient is overall doing well. Recovering from COVID but feeling much better this week. Other than her recent COVID infection, she feels well. No chest pain at rest, SOB, orthopnea or PND. Continues to have brief episodes of chest discomfort intermittently with exertion but discomfort resolves within 30s of resting. Has not used her nitroglycerin. Has been tolerating her imdur and metop without issue. Blood pressure is well controlled.   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 Swallow whole.   atorvastatin (LIPITOR) 20 MG tablet Take 1 tablet (20 mg total) by mouth daily.   Cranberry 405 MG CAPS Take by mouth.   famotidine (PEPCID) 20 MG tablet Take 20 mg by mouth daily.   fexofenadine (ALLEGRA) 180 MG tablet Take 180 mg by mouth daily.   metFORMIN (GLUCOPHAGE-XR) 500 MG 24 hr tablet Take 500 mg by mouth daily with breakfast.   metroNIDAZOLE (METROCREAM) 0.75 % cream Apply topically 2 (two) times daily.   Multiple Vitamin (MULTIVITAMINS PO) Take by mouth.   multivitamin-lutein (OCUVITE-LUTEIN) CAPS capsule Take 1 capsule by mouth daily.   nitroGLYCERIN (NITROSTAT) 0.4 MG SL tablet Place 1 tablet (0.4 mg total) under the tongue every 5 (five) minutes as needed for chest pain.   pregabalin (LYRICA) 50 MG capsule Take 50 mg by mouth at bedtime.   Turmeric 500 MG CAPS Take 2 capsules by mouth daily at 6 (six) AM.   [DISCONTINUED] isosorbide mononitrate (IMDUR) 30 MG 24 hr tablet Take 0.5 tablets (15 mg total) by mouth daily.   [DISCONTINUED] metoprolol succinate (TOPROL XL) 25 MG 24 hr tablet Take 0.5 tablets (12.5 mg total) by mouth at  bedtime.     Allergies:   Bactrim [sulfamethoxazole-trimethoprim], Camphor, Ciprofloxacin, Erythromycin, and Penicillins   Social History   Socioeconomic History   Marital status: Married    Spouse name: Not on file   Number of children: Not on file   Years of education: Not on file   Highest education level: Not on file  Occupational History   Not on file  Tobacco Use   Smoking status: Never    Passive exposure: Never   Smokeless tobacco: Never  Vaping Use   Vaping status: Never Used  Substance and Sexual Activity   Alcohol use: Yes   Drug use: Not Currently   Sexual activity: Not Currently     Birth control/protection: None  Other Topics Concern   Not on file  Social History Narrative   Not on file   Social Determinants of Health   Financial Resource Strain: Not on file  Food Insecurity: Not on file  Transportation Needs: Not on file  Physical Activity: Not on file  Stress: Not on file  Social Connections: Not on file     Family History: The patient's family history includes Breast cancer in her cousin, paternal aunt, paternal grandmother, and sister.  ROS:   Please see the history of present illness.     All other systems reviewed and are negative.  EKGs/Labs/Other Studies Reviewed:    The following studies were reviewed today: Coronary CTA 06/2022: FINDINGS: Image quality: Good   Noise artifact is: Limited   Coronary calcium score is 235, which places the patient in the 64th percentile for age and sex matched control.   Coronary arteries: Normal coronary origins.  Right dominance.   Right Coronary Artery: Mild mixed atherosclerotic plaque in the mid RCA   Left Main Coronary Artery: Minimal mixed atherosclerotic plaque in the proximal LM, <25% stenosis.   Left Anterior Descending Coronary Artery: Moderate atherosclerotic plaque in the ostial LAD, 50-69% stenosis. There is a probable total occlusion in the distal LAD just distal to the takeoff of diagonal 2, atherosclerotic plaque with 100% stenosis. There is distal recanalization, suggesting microchannel vs collateral flow.   Left Circumflex Artery: Mild atherosclerotic plaque in the proximal LCx and proximal OM1, 25-49% stenosis.   Aorta: Normal size, 33 mm at the mid ascending aorta (level of the PA bifurcation) measured double oblique.   Aortic Valve: No calcifications.   Other findings:   Normal pulmonary vein drainage into the left atrium.   Normal left atrial appendage without thrombus.   Normal size of the pulmonary artery.   Please see separate report from [facility] for  non-cardiac findings. Left breast calcifications noted, please refer to radiologist report for further characterization.   IMPRESSION: 1. Probable total occlusion of the distal LAD just after the second diagonal, 100% stenosis with distal recanalization suggesting microchannel vs collateral flow, CADRADS 5. CT FFR will be performed and reported separately. 2. Coronary calcium score is 235, which places the patient in the 64th percentile for age and sex matched control. 3. Normal coronary origins with right dominance.   RECOMMENDATIONS: CAD-RADS 5 Total coronary occlusion (100%). Consider cardiac catheterization or viability assessment. Consider symptom-guided anti-ischemic pharmacotherapy as well as risk factor modification per guideline directed care. Cardiac Monitor 05/2022:   Patch wear time is 13 days and 21 hours   Predominant rhythm was NSR with average HR 71bpm   There were 9 runs of SVT with longest lasting 19.9seconds   Rare SVE (<1%), rare VE (<1%)  No Afib or significant pauses   Patient triggered events correlate with sinus rhythm  TTE 06/14/22: IMPRESSIONS     1. Left ventricular ejection fraction, by estimation, is 60 to 65%. The  left ventricle has normal function. The left ventricle has no regional  wall motion abnormalities. There is mild left ventricular hypertrophy.  Left ventricular diastolic parameters  are consistent with Grade I diastolic dysfunction (impaired relaxation).   2. Right ventricular systolic function is normal. The right ventricular  size is normal. Tricuspid regurgitation signal is inadequate for assessing  PA pressure.   3. The mitral valve is abnormal. Trivial mitral valve regurgitation.   4. The aortic valve is tricuspid. Aortic valve regurgitation is not  visualized.   Comparison(s): Changes from prior study are noted. 02/03/2018: LVEF 55-60%.    EKG:  No new tracing today  Recent Labs: 04/09/2022: Hemoglobin 13.9; Platelets  265 09/12/2022: ALT 16; BUN 12; Creatinine, Ser 0.74; Potassium 4.9; Sodium 143  Recent Lipid Panel    Component Value Date/Time   CHOL 128 09/12/2022 0930   TRIG 97 09/12/2022 0930   HDL 60 09/12/2022 0930   CHOLHDL 2.1 09/12/2022 0930   LDLCALC 50 09/12/2022 0930     Risk Assessment/Calculations:    CHA2DS2-VASc Score = 4   This indicates a 4.8% annual risk of stroke. The patient's score is based upon: CHF History: 0 HTN History: 0 Diabetes History: 1 Stroke History: 0 Vascular Disease History: 0 Age Score: 2 Gender Score: 1               Physical Exam:    VS:  BP 116/62   Pulse 81   Ht 5' 6.5" (1.689 m)   Wt 151 lb 12.8 oz (68.9 kg)   SpO2 95%   BMI 24.13 kg/m     Wt Readings from Last 3 Encounters:  12/17/22 151 lb 12.8 oz (68.9 kg)  09/11/22 154 lb 3.2 oz (69.9 kg)  07/12/22 151 lb 9.6 oz (68.8 kg)     GEN:  Well nourished, well developed in no acute distress HEENT: Normal NECK: No JVD; No carotid bruits CARDIAC: RRR, no murmurs, rubs, gallops RESPIRATORY:  Clear to auscultation without rales, wheezing or rhonchi  ABDOMEN: Soft, non-tender, non-distended MUSCULOSKELETAL:  No edema; No deformity  SKIN: Warm and dry NEUROLOGIC:  Alert and oriented x 3 PSYCHIATRIC:  Normal affect   ASSESSMENT:    1. Coronary artery disease of native artery of native heart with stable angina pectoris (HCC)   2. Paroxysmal atrial fibrillation (HCC)   3. Hyperlipidemia LDL goal <70   4. Type 2 diabetes mellitus with diabetic peripheral angiopathy without gangrene, without long-term current use of insulin (HCC)      PLAN:    In order of problems listed above:  #CAD: Coronary CTA with moderate ostial LAD disease (FFR negative) and CTO of distal LAD. RCA and Lcx with mild disease. Given stable symptoms, will trial medical therapy at this time. If symptoms persist, can plan for cath for further evaluation. -Continue ASA 81mg  daily -Continue lipitor 20mg   daily -Continue imdur 15mg  daily in the morning (okay to stop if no significant improvement in symptoms on the medication) -Continue metop 12.5mg  XL in the evening -If symptoms persist despite medical therapy, will plan for cath at that time  #Paroxysmal Afib: CHADs-vasc 4. Isolated episode on home monitor with no recurrence on 2 week zio. Has been maintained on ASA. Denies any recurrence of symptoms. Declined loop but will continue  to monitor with apple watch. -Declined loop; will continue to monitor with apple watch -Not on [facility] yet due to isolated, brief episode. She is monitoring as above.  #HLD: -Continue lipitor 20mg  daily -LDL 50; 08/2022 -LDL goal <10  #DMII: -Continue metformin 500mg  daily          Medication Adjustments/Labs and Tests Ordered: Current medicines are reviewed at length with the patient today.  Concerns regarding medicines are outlined above.  No orders of the defined types were placed in this encounter.  Meds ordered this encounter  Medications   metoprolol succinate (TOPROL XL) 25 MG 24 hr tablet    Sig: Take 0.5 tablets (12.5 mg total) by mouth at bedtime.    Dispense:  45 tablet    Refill:  3   isosorbide mononitrate (IMDUR) 30 MG 24 hr tablet    Sig: Take 0.5 tablets (15 mg total) by mouth daily.    Dispense:  45 tablet    Refill:  3    Patient Instructions  Medication Instructions:  Your physician recommends that you continue on your current medications as directed. Please refer to the Current Medication list given to you today.  *If you need a refill on your cardiac medications before your next appointment, please call your pharmacy*   Follow-Up: At [facility], you and your health needs are our priority.  As part of our continuing mission to provide you with exceptional heart care, we have created designated Provider Care Teams.  These Care Teams include your primary Cardiologist (physician) and Advanced Practice Providers (APPs -   Physician Assistants and Nurse Practitioners) who all work together to provide you with the care you need, when you need it.   Your next appointment:   6 month(s)  Provider:   Jodelle Red, MD      Signed, Meriam Sprague, MD  12/17/2022 9:35 AM    [facility]

## 2022-12-13 DIAGNOSIS — E119 Type 2 diabetes mellitus without complications: Secondary | ICD-10-CM | POA: Diagnosis not present

## 2022-12-13 DIAGNOSIS — Z961 Presence of intraocular lens: Secondary | ICD-10-CM | POA: Diagnosis not present

## 2022-12-13 DIAGNOSIS — H5203 Hypermetropia, bilateral: Secondary | ICD-10-CM | POA: Diagnosis not present

## 2022-12-15 IMAGING — CT CT ANGIO HEAD-NECK (W OR W/O PERF)
2 of 11 series · 7 of 33 positions shown · IV contrast (APPLIED)
Comparison: No prior CTA, correlation is made with CT head
02/02/2018

CLINICAL DATA: Occipital headache, weakness

EXAM:
CT ANGIOGRAPHY HEAD AND NECK
TECHNIQUE: Multidetector CT imaging of the head and neck was performed using
the standard protocol during bolus administration of intravenous
contrast. Multiplanar CT image reconstructions and MIPs were
obtained to evaluate the vascular anatomy. Carotid stenosis
measurements (when applicable) are obtained utilizing NASCET
criteria, using the distal internal carotid diameter as the
denominator.

[Series 8: cta head · axial · 0.52mm/px · z∈[-390,-284]mm · 2 of 160 slices shown]
[im 54/160  soft-tissue]
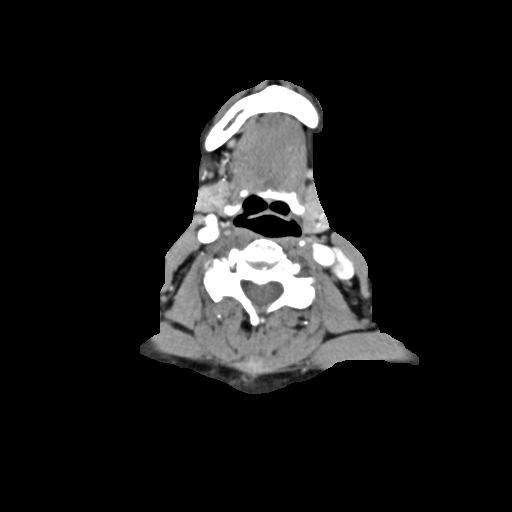
[im 107/160  soft-tissue]
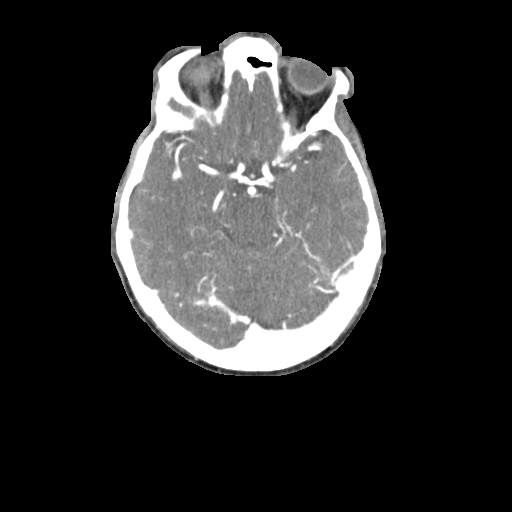

[Series 10: ax thin · axial · 0.44mm/px · z∈[-449,-241]mm · 5 of 314 slices shown]
[im 53/314  soft-tissue]
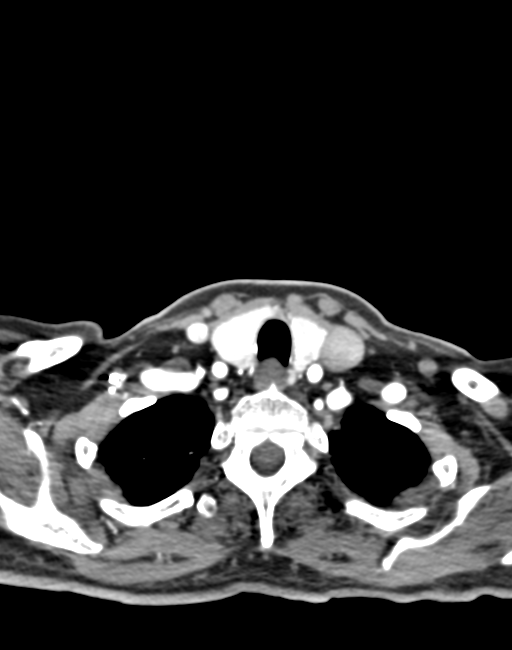
[im 105/314  bone]
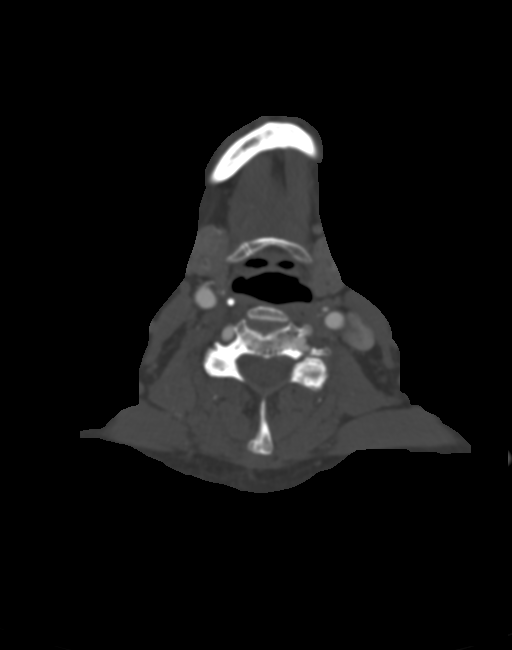
[im 157/314  soft-tissue]
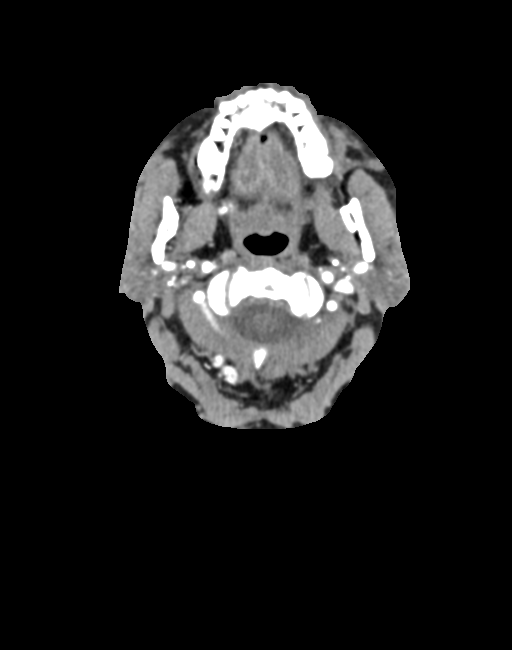
[im 209/314  bone]
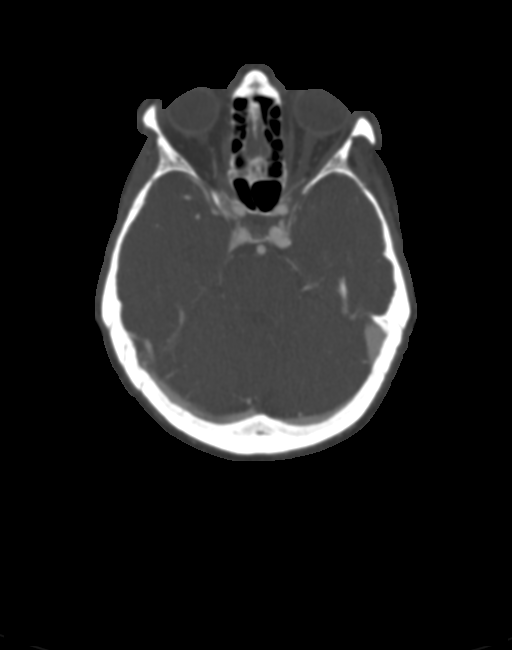
[im 261/314  soft-tissue]
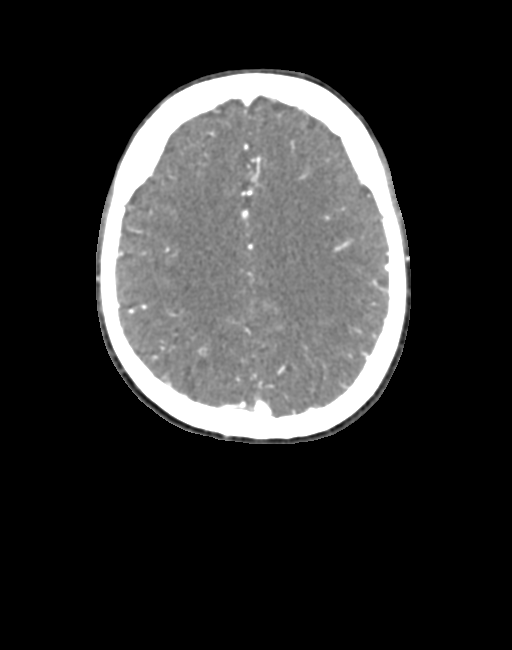

[7 of 33 positions shown; findings below may reference images not displayed]

RADIATION DOSE REDUCTION: This exam was performed according to the
departmental dose-optimization program which includes automated
exposure control, adjustment of the mA and/or kV according to
patient size and/or use of iterative reconstruction technique.

CONTRAST:  100mL OMNIPAQUE IOHEXOL 350 MG/ML SOLN
FINDINGS: CT HEAD FINDINGS

Brain: No evidence of acute infarction, hemorrhage, cerebral edema,
mass, mass effect, or midline shift. No hydrocephalus or extra-axial
fluid collection. Cerebral volume is within normal limits for age.

Vascular: No hyperdense vessel.

Skull: Normal. Negative for fracture or focal lesion.

Sinuses/Orbits: No acute finding. Status post bilateral lens
replacements.

Other: The mastoid air cells are well aerated.

CTA NECK FINDINGS

Aortic arch: Standard branching. Imaged portion shows no evidence of
aneurysm or dissection. No significant stenosis of the major arch
vessel origins.

Right carotid system: No evidence of dissection, stenosis (50% or
greater) or occlusion.

Left carotid system: No evidence of dissection, stenosis (50% or
greater) or occlusion.

Vertebral arteries: Codominant. No evidence of dissection, stenosis
(50% or greater) or occlusion.

Skeleton: Degenerative changes in the cervical spine. No acute
osseous abnormality.

Other neck: Multinodular thyroid, which was most recently evaluated
on ultrasound on 10/04/2020.

Upper chest: No focal pulmonary opacity or pleural effusion.

Review of the MIP images confirms the above findings

CTA HEAD FINDINGS

Anterior circulation: Both internal carotid arteries are patent to
the termini, without significant stenosis.

A1 segments patent. Normal anterior communicating artery. Anterior
cerebral arteries are patent to their distal aspects.

No M1 stenosis or occlusion. Normal MCA bifurcations. Distal MCA
branches perfused and symmetric.

Posterior circulation: Vertebral arteries patent to the
vertebrobasilar junction without stenosis. The left PICA is not
definitively visualized and may be supplied by a left AICA. The
right PICA is patent.

Basilar patent to its distal aspect. Superior cerebellar arteries
patent bilaterally, with the right superior cerebellar artery
appearing to takeoff from the right P1.

Patent P1 segments. PCAs perfused to their distal aspects without
stenosis. The bilateral posterior communicating arteries are not
visualized.

Venous sinuses: As permitted by contrast timing, patent.

Anatomic variants: None significant.

Review of the MIP images confirms the above findings
IMPRESSION: 1.  No acute intracranial process.
2.  No hemodynamically significant stenosis in the neck.
3.  No intracranial large vessel occlusion or significant stenosis.
4. Multiple lesions in the thyroid, which was most recently
evaluated with ultrasound on 10/04/2020.

## 2022-12-17 ENCOUNTER — Encounter: Payer: Self-pay | Admitting: Cardiology

## 2022-12-17 ENCOUNTER — Ambulatory Visit: Payer: Medicare Other | Attending: Cardiology | Admitting: Cardiology

## 2022-12-17 VITALS — BP 116/62 | HR 81 | Ht 66.5 in | Wt 151.8 lb

## 2022-12-17 DIAGNOSIS — E785 Hyperlipidemia, unspecified: Secondary | ICD-10-CM | POA: Diagnosis not present

## 2022-12-17 DIAGNOSIS — I25118 Atherosclerotic heart disease of native coronary artery with other forms of angina pectoris: Secondary | ICD-10-CM

## 2022-12-17 DIAGNOSIS — E1151 Type 2 diabetes mellitus with diabetic peripheral angiopathy without gangrene: Secondary | ICD-10-CM | POA: Diagnosis not present

## 2022-12-17 DIAGNOSIS — I480 Paroxysmal atrial fibrillation: Secondary | ICD-10-CM

## 2022-12-17 MED ORDER — METOPROLOL SUCCINATE ER 25 MG PO TB24: 12.5000 mg | ORAL_TABLET | Freq: Every day | ORAL | 3 refills | Status: DC

## 2022-12-17 MED ORDER — ISOSORBIDE MONONITRATE ER 30 MG PO TB24: 15.0000 mg | ORAL_TABLET | Freq: Every day | ORAL | 3 refills | Status: DC

## 2022-12-17 NOTE — Patient Instructions (Signed)
Medication Instructions:  Your physician recommends that you continue on your current medications as directed. Please refer to the Current Medication list given to you today.  *If you need a refill on your cardiac medications before your next appointment, please call your pharmacy*  Follow-Up: At [facility], you and your health needs are our priority.  As part of our continuing mission to provide you with exceptional heart care, we have created designated Provider Care Teams.  These Care Teams include your primary Cardiologist (physician) and Advanced Practice Providers (APPs -  Physician Assistants and Nurse Practitioners) who all work together to provide you with the care you need, when you need it.  Your next appointment:   6 month(s)  Provider:   Bridgette Christopher, MD     

## 2023-02-03 DIAGNOSIS — I4891 Unspecified atrial fibrillation: Secondary | ICD-10-CM | POA: Diagnosis not present

## 2023-02-03 DIAGNOSIS — R000 Tachycardia, unspecified: Secondary | ICD-10-CM | POA: Diagnosis not present

## 2023-02-04 NOTE — Telephone Encounter (Signed)
Please advise 

## 2023-02-04 NOTE — Telephone Encounter (Signed)
Printed and placed on provider desk for review

## 2023-04-22 ENCOUNTER — Encounter (HOSPITAL_BASED_OUTPATIENT_CLINIC_OR_DEPARTMENT_OTHER): Payer: Self-pay

## 2023-04-22 DIAGNOSIS — G500 Trigeminal neuralgia: Secondary | ICD-10-CM | POA: Diagnosis not present

## 2023-04-22 DIAGNOSIS — D6869 Other thrombophilia: Secondary | ICD-10-CM | POA: Diagnosis not present

## 2023-04-22 DIAGNOSIS — E1169 Type 2 diabetes mellitus with other specified complication: Secondary | ICD-10-CM | POA: Diagnosis not present

## 2023-04-22 DIAGNOSIS — I480 Paroxysmal atrial fibrillation: Secondary | ICD-10-CM

## 2023-04-22 DIAGNOSIS — I25118 Atherosclerotic heart disease of native coronary artery with other forms of angina pectoris: Secondary | ICD-10-CM | POA: Diagnosis not present

## 2023-04-22 DIAGNOSIS — E1142 Type 2 diabetes mellitus with diabetic polyneuropathy: Secondary | ICD-10-CM | POA: Diagnosis not present

## 2023-04-26 DIAGNOSIS — Z6824 Body mass index (BMI) 24.0-24.9, adult: Secondary | ICD-10-CM | POA: Diagnosis not present

## 2023-04-26 DIAGNOSIS — R350 Frequency of micturition: Secondary | ICD-10-CM | POA: Diagnosis not present

## 2023-04-26 DIAGNOSIS — N3000 Acute cystitis without hematuria: Secondary | ICD-10-CM | POA: Diagnosis not present

## 2023-05-01 ENCOUNTER — Ambulatory Visit: Payer: Medicare Other | Attending: Family

## 2023-05-01 DIAGNOSIS — I480 Paroxysmal atrial fibrillation: Secondary | ICD-10-CM

## 2023-05-01 NOTE — Progress Notes (Unsigned)
 Enrolled patient for a 14 day Zio XT monitor to be mailed to patients home  Alison Fields to read

## 2023-05-08 DIAGNOSIS — I480 Paroxysmal atrial fibrillation: Secondary | ICD-10-CM | POA: Diagnosis not present

## 2023-05-15 ENCOUNTER — Other Ambulatory Visit: Payer: Self-pay | Admitting: Family Medicine

## 2023-05-15 DIAGNOSIS — Z1231 Encounter for screening mammogram for malignant neoplasm of breast: Secondary | ICD-10-CM

## 2023-05-20 DIAGNOSIS — Z6824 Body mass index (BMI) 24.0-24.9, adult: Secondary | ICD-10-CM | POA: Diagnosis not present

## 2023-05-20 DIAGNOSIS — R350 Frequency of micturition: Secondary | ICD-10-CM | POA: Diagnosis not present

## 2023-06-03 DIAGNOSIS — I480 Paroxysmal atrial fibrillation: Secondary | ICD-10-CM | POA: Diagnosis not present

## 2023-06-18 ENCOUNTER — Ambulatory Visit
Admission: RE | Admit: 2023-06-18 | Discharge: 2023-06-18 | Disposition: A | Discharge status: Home / Self Care | Payer: Medicare Other | Source: Ambulatory Visit | Attending: Family Medicine | Admitting: Family Medicine

## 2023-06-18 DIAGNOSIS — Z1231 Encounter for screening mammogram for malignant neoplasm of breast: Secondary | ICD-10-CM | POA: Diagnosis not present

## 2023-06-19 ENCOUNTER — Ambulatory Visit (HOSPITAL_BASED_OUTPATIENT_CLINIC_OR_DEPARTMENT_OTHER): Payer: Medicare Other | Admitting: Cardiology

## 2023-06-20 ENCOUNTER — Other Ambulatory Visit: Payer: Self-pay | Admitting: Family Medicine

## 2023-06-20 DIAGNOSIS — R928 Other abnormal and inconclusive findings on diagnostic imaging of breast: Secondary | ICD-10-CM

## 2023-06-22 NOTE — Progress Notes (Unsigned)
Cardiology Office Note:  .   Date:  06/24/2023  ID:  Alison Fields, DOB 09-30-1940, MRN [id number] PCP: Laurann Montana, MD  [facility] Providers Cardiologist:  Jodelle Red, MD    Patient Profile: .      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 Katrinka Blazing 04/2022 for episodes of palpitations for which she had been in the ER and was found to be in A-fib at that time.  She was started on aspirin by Dr. Katrinka Blazing and a 2-week monitor was placed.  Cardiac monitor completed 06/05/2022 showed no A-fib.  TTE 06/14/2022 with LVEF 60 to 65%, G1 DD, normal RV, trivial MR.    She was seen in clinic 07/02/2022 by Dr. Shari Prows and was complaining of chest pain on exertion that was relieved with rest.  Coronary CTA revealed likely CTO of the distal LAD with ostial 50 to 69% stenosis.  RCA and LCx with mild disease.  CTA results were reviewed thoroughly by Dr. Shari Prows and discussion regarding goal to treat medically rather than pursue cardiac catheterization.  She was advised to continue aspirin 81 mg daily, increase atorvastatin to 20 mg daily, and start Imdur 15 mg daily in the morning and metoprolol succinate 12.5 mg in the evening.  She reported no further episodes of PAF which she was monitoring on her Apple Watch.  She was not on anticoagulation at the time.  Last clinic visit was 12/17/2022 with Dr. Shari Prows.  She was recovering from COVID but was feeling much better that week.  She continued to have brief episodes of chest discomfort intermittently with exertion but discomfort resolves within 30 seconds of resting.  She had not used nitroglycerin.  She was advised her CHA2DS2-VASc score was 4.  She declined ILR.   On 04/22/2023 she contacted our office to report atrial fibrillation  on her Apple Watch.  She was started on Eliquis 5 mg twice daily by PCP.  She was prescribed a cardiac monitor by Gillian Shields, NP which revealed atrial fibrillation/flutter burden 2% with longest episode lasting 5 hours 48 minutes.         History of Present Illness: .   Alison Fields is a very pleasant 83 y.o. female who is here today for follow-up of CAD and PAF. She has been maintaining a daily walking regimen, even in cold weather. She occasionally feels some chest pressure with exercise and asks about nitroglycerin use. She keeps it on hand but has never needed to use it. She reports no adverse effects from starting Eliquis, a blood thinner. She denies shortness of breath, palpitations, orthopnea, PND, edema, presyncope or syncope. In September, she experienced a significant atrial fibrillation episode, which was managed with an additional half dose of Toprol XL. She has not experienced a similar episode since but her husband asks about additional metoprolol if this occurred again. We reviewed cardiac monitor results which revealed a 2% atrial fibrillation burden, with the longest episode lasting almost six hours, and occurred during the night. She did not have any symptoms, she was likely sleeping.  Reports she thinks she had recent lab work with PCP.    Discussed the use of AI scribe software for clinical note transcription with the patient, who gave verbal consent to proceed.   ROS: See HPI  Studies Reviewed: Marland Kitchen   EKG Interpretation Date/Time:  Monday June 24 2023 10:20:47 EST Ventricular Rate:  82 PR Interval:  168 QRS Duration:  84 QT Interval:  370 QTC Calculation: 432 R Axis:   72  Text Interpretation: Normal sinus rhythm Nonspecific ST and T wave abnormality When compared with ECG of 09-Apr-2022 14:02, No significant change was found Confirmed by Eligha Bridegroom [phone]) on 06/24/2023 10:27:25 AM     Risk Assessment/Calculations:    CHA2DS2-VASc Score = 4   This  indicates a 4.8% annual risk of stroke. The patient's score is based upon: CHF History: 0 HTN History: 0 Diabetes History: 0 Stroke History: 0 Vascular Disease History: 1 Age Score: 2 Gender Score: 1            Physical Exam:   VS:  BP 108/64 (BP Location: Right Arm, Patient Position: Sitting, Cuff Size: Normal)   Pulse 82   Ht 5' 6.5" (1.689 m)   Wt 155 lb 6.4 oz (70.5 kg)   SpO2 97%   BMI 24.71 kg/m    Wt Readings from Last 3 Encounters:  06/24/23 155 lb 6.4 oz (70.5 kg)  12/17/22 151 lb 12.8 oz (68.9 kg)  09/11/22 154 lb 3.2 oz (69.9 kg)    GEN: Well nourished, well developed in no acute distress NECK: No JVD; No carotid bruits CARDIAC: RRR, no murmurs, rubs, gallops RESPIRATORY:  Clear to auscultation without rales, wheezing or rhonchi  ABDOMEN: Soft, non-tender, non-distended EXTREMITIES:  No edema; No deformity     ASSESSMENT AND PLAN: .    PAF on chronic anticoagulation: EKG today reveals NSR at 82 bpm.  No recent tachypalpitations.  We discussed appropriate use of additional metoprolol if she had a persistent episode of atrial fibrillation. Advised an additional Toprol XL 25 mg could be used PRN.  No bleeding concerns. Continue Eliquis 5 mg twice daily which is appropriate dose for stroke prevention for CHA2DS2-VASc score of 4.  Continue metoprolol for rate control.  Coronary artery disease: Coronary CTA 07/09/2022 with coronary calcium score of 235 (64th percentile), probable total occlusion of distal LAD just after the second diagonal, 100% stenosis with distal recanalization suggesting microchannel versus collateral flow. FFR modeled a total occlusion of the distal LAD, no other hemodynamically significant stenoses. She has been on GDMT and reports occasional chest pressure which does not limit her activities. She continues to walk regularly for exercise. She has not had any worsening symptoms during the cold weather. No bleeding concerns. CBC from 02/03/23 reveals stable  blood counts. We will continue GDMT with aspirin, Imdur, metoprolol.  Diabetes: A1C 6.8 on 04/22/23.  We discussed the importance of continuing regular exercise and healthy diet. Management per PCP.   Hyperlipidemia LDL goal < 70: Lipid panel completed 09/12/2022 revealed total cholesterol 128, triglycerides 97, HDL 60, LDL-C 50.  She thought she had sooner testing, however we contacted PCP and this was the most recent lipid panel. She will return within a few days for fasting blood work.       Disposition:6 months with Dr. Cristal Deer or me  Signed, Eligha Bridegroom, NP-C

## 2023-06-24 ENCOUNTER — Ambulatory Visit: Payer: Medicare Other | Attending: Nurse Practitioner | Admitting: Nurse Practitioner

## 2023-06-24 ENCOUNTER — Other Ambulatory Visit: Payer: Self-pay | Admitting: *Deleted

## 2023-06-24 ENCOUNTER — Encounter: Payer: Self-pay | Admitting: Nurse Practitioner

## 2023-06-24 ENCOUNTER — Telehealth: Payer: Self-pay | Admitting: *Deleted

## 2023-06-24 VITALS — BP 108/64 | HR 82 | Ht 66.5 in | Wt 155.4 lb

## 2023-06-24 DIAGNOSIS — I25118 Atherosclerotic heart disease of native coronary artery with other forms of angina pectoris: Secondary | ICD-10-CM

## 2023-06-24 DIAGNOSIS — I480 Paroxysmal atrial fibrillation: Secondary | ICD-10-CM | POA: Diagnosis not present

## 2023-06-24 DIAGNOSIS — Z7901 Long term (current) use of anticoagulants: Secondary | ICD-10-CM

## 2023-06-24 DIAGNOSIS — E785 Hyperlipidemia, unspecified: Secondary | ICD-10-CM | POA: Diagnosis not present

## 2023-06-24 DIAGNOSIS — E1151 Type 2 diabetes mellitus with diabetic peripheral angiopathy without gangrene: Secondary | ICD-10-CM | POA: Diagnosis not present

## 2023-06-24 NOTE — Telephone Encounter (Signed)
Pls call her and tell her we confirmed that she did not have cholesterol tests from PCP since 08/2022. Please order lipid and CMET to be done soon. S/w pt will get fasting labs in the next several days. Labs ordered and released.

## 2023-06-24 NOTE — Patient Instructions (Signed)
Medication Instructions:   Your physician recommends that you continue on your current medications as directed. Please refer to the Current Medication list given to you today.   *If you need a refill on your cardiac medications before your next appointment, please call your pharmacy*   Lab Work:  None ordered.  If you have labs (blood work) drawn today and your tests are completely normal, you will receive your results only by: MyChart Message (if you have MyChart) OR A paper copy in the mail If you have any lab test that is abnormal or we need to change your treatment, we will call you to review the results.   Testing/Procedures:  None ordered.   Follow-Up: At [facility], you and your health needs are our priority.  As part of our continuing mission to provide you with exceptional heart care, we have created designated Provider Care Teams.  These Care Teams include your primary Cardiologist (physician) and Advanced Practice Providers (APPs -  Physician Assistants and Nurse Practitioners) who all work together to provide you with the care you need, when you need it.  We recommend signing up for the patient portal called "MyChart".  Sign up information is provided on this After Visit Summary.  MyChart is used to connect with patients for Virtual Visits (Telemedicine).  Patients are able to view lab/test results, encounter notes, upcoming appointments, etc.  Non-urgent messages can be sent to your provider as well.   To learn more about what you can do with MyChart, go to [email or web address].    Your next appointment:   6 month(s)  Provider:   Jodelle Red, MD or Eligha Bridegroom, NP    Other Instructions  Your physician wants you to follow-up in: 6 months.  You will receive a reminder letter in the mail two months in advance. If you don't receive a letter, please call our office to schedule the follow-up appointment.

## 2023-06-26 DIAGNOSIS — I25118 Atherosclerotic heart disease of native coronary artery with other forms of angina pectoris: Secondary | ICD-10-CM | POA: Diagnosis not present

## 2023-06-26 DIAGNOSIS — I480 Paroxysmal atrial fibrillation: Secondary | ICD-10-CM | POA: Diagnosis not present

## 2023-06-26 DIAGNOSIS — E785 Hyperlipidemia, unspecified: Secondary | ICD-10-CM | POA: Diagnosis not present

## 2023-06-27 LAB — COMPREHENSIVE METABOLIC PANEL
ALT: 13 [IU]/L (ref 0–32)
AST: 20 [IU]/L (ref 0–40)
Albumin: 4.4 g/dL (ref 3.7–4.7)
Alkaline Phosphatase: 58 [IU]/L (ref 44–121)
BUN/Creatinine Ratio: 15 (ref 12–28)
BUN: 11 mg/dL (ref 8–27)
Bilirubin Total: 0.6 mg/dL (ref 0.0–1.2)
CO2: 24 mmol/L (ref 20–29)
Calcium: 9.2 mg/dL (ref 8.7–10.3)
Chloride: 103 mmol/L (ref 96–106)
Creatinine, Ser: 0.74 mg/dL (ref 0.57–1.00)
Globulin, Total: 2.4 g/dL (ref 1.5–4.5)
Glucose: 110 mg/dL — ABNORMAL HIGH (ref 70–99)
Potassium: 4.5 mmol/L (ref 3.5–5.2)
Sodium: 142 mmol/L (ref 134–144)
Total Protein: 6.8 g/dL (ref 6.0–8.5)
eGFR: 81 mL/min/{1.73_m2} (ref >59)

## 2023-06-27 LAB — LIPID PANEL
Chol/HDL Ratio: 2.3 {ratio} (ref 0.0–4.4)
Cholesterol, Total: 123 mg/dL (ref 100–199)
HDL: 53 mg/dL (ref >39)
LDL Chol Calc (NIH): 51 mg/dL (ref 0–99)
Triglycerides: 104 mg/dL (ref 0–149)
VLDL Cholesterol Cal: 19 mg/dL (ref 5–40)

## 2023-06-28 ENCOUNTER — Other Ambulatory Visit: Payer: Self-pay

## 2023-06-28 MED ORDER — ATORVASTATIN CALCIUM 20 MG PO TABS: 20.0000 mg | ORAL_TABLET | Freq: Every day | ORAL | 3 refills | Status: AC

## 2023-07-04 ENCOUNTER — Ambulatory Visit: Payer: Medicare Other

## 2023-07-04 ENCOUNTER — Ambulatory Visit
Admission: RE | Admit: 2023-07-04 | Discharge: 2023-07-04 | Disposition: A | Discharge status: Home / Self Care | Payer: Medicare Other | Source: Ambulatory Visit | Attending: Family Medicine | Admitting: Family Medicine

## 2023-07-04 DIAGNOSIS — R928 Other abnormal and inconclusive findings on diagnostic imaging of breast: Secondary | ICD-10-CM | POA: Diagnosis not present

## 2023-11-07 DIAGNOSIS — G500 Trigeminal neuralgia: Secondary | ICD-10-CM | POA: Diagnosis not present

## 2023-11-07 DIAGNOSIS — K219 Gastro-esophageal reflux disease without esophagitis: Secondary | ICD-10-CM | POA: Diagnosis not present

## 2023-11-07 DIAGNOSIS — M8588 Other specified disorders of bone density and structure, other site: Secondary | ICD-10-CM | POA: Diagnosis not present

## 2023-11-07 DIAGNOSIS — Z853 Personal history of malignant neoplasm of breast: Secondary | ICD-10-CM | POA: Diagnosis not present

## 2023-11-07 DIAGNOSIS — E041 Nontoxic single thyroid nodule: Secondary | ICD-10-CM | POA: Diagnosis not present

## 2023-11-07 DIAGNOSIS — M25552 Pain in left hip: Secondary | ICD-10-CM | POA: Diagnosis not present

## 2023-11-07 DIAGNOSIS — Z0000 Encounter for general adult medical examination without abnormal findings: Secondary | ICD-10-CM | POA: Diagnosis not present

## 2023-11-07 DIAGNOSIS — I480 Paroxysmal atrial fibrillation: Secondary | ICD-10-CM | POA: Diagnosis not present

## 2023-11-07 DIAGNOSIS — I25118 Atherosclerotic heart disease of native coronary artery with other forms of angina pectoris: Secondary | ICD-10-CM | POA: Diagnosis not present

## 2023-11-07 DIAGNOSIS — E1142 Type 2 diabetes mellitus with diabetic polyneuropathy: Secondary | ICD-10-CM | POA: Diagnosis not present

## 2023-11-07 DIAGNOSIS — E1169 Type 2 diabetes mellitus with other specified complication: Secondary | ICD-10-CM | POA: Diagnosis not present

## 2023-11-08 ENCOUNTER — Other Ambulatory Visit (HOSPITAL_BASED_OUTPATIENT_CLINIC_OR_DEPARTMENT_OTHER): Payer: Self-pay | Admitting: Family Medicine

## 2023-11-08 DIAGNOSIS — M8580 Other specified disorders of bone density and structure, unspecified site: Secondary | ICD-10-CM

## 2023-11-27 ENCOUNTER — Encounter (HOSPITAL_BASED_OUTPATIENT_CLINIC_OR_DEPARTMENT_OTHER): Payer: Self-pay | Admitting: Nurse Practitioner

## 2023-11-27 ENCOUNTER — Ambulatory Visit (HOSPITAL_BASED_OUTPATIENT_CLINIC_OR_DEPARTMENT_OTHER): Admitting: Nurse Practitioner

## 2023-11-27 VITALS — BP 100/50 | HR 70 | Ht 66.0 in | Wt 154.0 lb

## 2023-11-27 DIAGNOSIS — I471 Supraventricular tachycardia, unspecified: Secondary | ICD-10-CM

## 2023-11-27 DIAGNOSIS — E785 Hyperlipidemia, unspecified: Secondary | ICD-10-CM | POA: Diagnosis not present

## 2023-11-27 DIAGNOSIS — R011 Cardiac murmur, unspecified: Secondary | ICD-10-CM

## 2023-11-27 DIAGNOSIS — I480 Paroxysmal atrial fibrillation: Secondary | ICD-10-CM | POA: Diagnosis not present

## 2023-11-27 DIAGNOSIS — Z7901 Long term (current) use of anticoagulants: Secondary | ICD-10-CM

## 2023-11-27 DIAGNOSIS — I25118 Atherosclerotic heart disease of native coronary artery with other forms of angina pectoris: Secondary | ICD-10-CM | POA: Diagnosis not present

## 2023-11-27 MED ORDER — METOPROLOL TARTRATE 25 MG PO TABS: ORAL_TABLET | ORAL | 3 refills | Status: AC

## 2023-11-27 NOTE — Progress Notes (Signed)
 Cardiology Office Note:  .   Date:  11/27/2023  ID:  Alison Fields, DOB Mar 03, 1941, MRN [id number] PCP: Teresa Channel, MD  [facility] Providers Cardiologist:  Shelda Bruckner, MD    Patient Profile: .      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 Claudene 04/2022 for episodes of palpitations for which she had been in the ER and was found to be in A-fib at that time.  She was started on aspirin  by Dr. Claudene and a 2-week monitor was placed.  Cardiac monitor completed 06/05/2022 showed no A-fib.  TTE 06/14/2022 with LVEF 60 to 65%, G1 DD, normal RV, trivial MR.    She was seen in clinic 07/02/2022 by Dr. Hobart and was complaining of chest pain on exertion that was relieved with rest.  Coronary CTA revealed likely CTO of the distal LAD with ostial 50 to 69% stenosis.  RCA and LCx with mild disease.  CTA results were reviewed thoroughly by Dr. Hobart and discussion regarding goal to treat medically rather than pursue cardiac catheterization.  She was advised to continue aspirin  81 mg daily, increase atorvastatin  to 20 mg daily, and start Imdur  15 mg daily in the morning and metoprolol  succinate 12.5 mg in the evening.  She reported no further episodes of PAF which she was monitoring on her Apple Watch.  She was not on anticoagulation at the time.  Last clinic visit was 12/17/2022 with Dr. Hobart.  She was recovering from COVID but was feeling much better that week.  She continued to have brief episodes of chest discomfort intermittently with exertion but discomfort resolves within 30 seconds of resting.  She had not used nitroglycerin .  She was advised her CHA2DS2-VASc score was 4.  She declined ILR.   On 04/22/2023 she contacted our office to report atrial fibrillation  on her Apple Watch.  She was started on Eliquis 5 mg twice daily by PCP.  She was prescribed a cardiac monitor by Reche Finder, NP which revealed atrial fibrillation/flutter burden 2% with longest episode lasting 5 hours 48 minutes.    Seen by me on 06/24/23 for follow-up of CAD and PAF.  Was maintaining a daily walking regimen, even in cold weather. She occasionally feels some chest pressure with exercise and asks about nitroglycerin  use. She keeps it on hand but has never needed to use it. Reported no adverse effects from starting Eliquis, a blood thinner. She denied shortness of breath, palpitations, orthopnea, PND, edema, presyncope or syncope. In September, she experienced a significant atrial fibrillation episode, which was managed with an additional half dose of Toprol  XL. She has not experienced a similar episode since but her husband asks about additional metoprolol  if this occurred again. We reviewed cardiac monitor results which revealed a 2% atrial fibrillation burden, with the longest episode lasting almost six hours, and occurred during the night, she was sleeping. Lipid panel revealed well controlled lipids.          History of Present Illness: .   Discussed the use of AI scribe software for clinical note transcription with the patient, who gave verbal consent to proceed.  History of Present Illness Alison Fields is a very pleasant 83 year old female with atrial fibrillation who presents with episodes of very fast heart rate. In  the past three weeks, she has experienced two episodes of very fast heart rate, each lasting 10 to 20 minutes, recorded on her phone. Apple Watch did not indicate atrial fibrillation. These episodes occurred without preceding physical exertion. She takes Toprol  XL nightly for heart rate control. She has not taken additional metoprolol  for episodes of tachycardia.  We reviewed the findings from her cardiac monitor on 05/2023 that revealed some short episodes of SVT.   She had 2% burden of atrial flutter, possible additional episodes of atrial fibrillation. She denies chest pain, shortness of breath, palpitations, orthopnea, PND, edema, presyncope or syncope.   Discussed the use of AI scribe software for clinical note transcription with the patient, who gave verbal consent to proceed.   ROS: See HPI       Studies Reviewed: .        Risk Assessment/Calculations:    CHA2DS2-VASc Score = 4   This indicates a 4.8% annual risk of stroke. The patient's score is based upon: CHF History: 0 HTN History: 0 Diabetes History: 0 Stroke History: 0 Vascular Disease History: 1 Age Score: 2 Gender Score: 1            Physical Exam:   VS:  BP (!) 100/50   Pulse 70   Ht 5' 6 (1.676 m)   Wt 154 lb (69.9 kg)   SpO2 94%   BMI 24.86 kg/m    Wt Readings from Last 3 Encounters:  11/27/23 154 lb (69.9 kg)  06/24/23 155 lb 6.4 oz (70.5 kg)  12/17/22 151 lb 12.8 oz (68.9 kg)    GEN: Well nourished, well developed in no acute distress NECK: No JVD; No carotid bruits CARDIAC: RRR, soft systolic murmur. No rubs, gallops RESPIRATORY:  Clear to auscultation without rales, wheezing or rhonchi  ABDOMEN: Soft, non-tender, non-distended EXTREMITIES:  No edema; No deformity     ASSESSMENT AND PLAN: .     PAF on chronic anticoagulation PSVT Clinically appears to be maintaining sinus rhythm today.  HR is well-controlled.  Two recent episodes of tachycardia that were not reported as A-fib, each lasting 10-20 minutes. She did not have presyncope or syncope. They resolved with rest and relaxation.  No bleeding concerns. Continue Eliquis 5 mg twice daily which is appropriate dose for stroke prevention for CHA2DS2-VASc score of 4.  We are adding Lopressor  25 mg to use as needed for tachypalpitations. Continue daily Toprol -XL for rate control.  Coronary artery disease Coronary CTA 07/09/2022 with coronary calcium  score of 235 (64th percentile), probable total occlusion of  distal LAD just after the second diagonal, 100% stenosis with distal recanalization suggesting microchannel versus collateral flow. FFR modeled a total occlusion of the distal LAD, no other hemodynamically significant stenoses. She has been on GDMT and reports occasional chest pressure which does not limit her activities. She continues to walk regularly for exercise.    Hyperlipidemia LDL goal < 70 Lipid panel completed 06/26/23 with total cholesterol 123, triglycerides 104, HDL 53, and LDL-C 51. Lipids are well controlled.  Continue atorvastatin .  Murmur She has a soft systolic murmur on exam. She is asymptomatic.  Echo 06/14/2022 with normal LV function, G1, trivial MR.  We will recheck echo for evaluation of structural heart disease.        Disposition:  6 months with Dr. Lonni  Signed, Rosaline Bane, NP-C

## 2023-11-27 NOTE — Patient Instructions (Signed)
 Medication Instructions:   START Lopressor  one (1) tablet by mouth ( 25 mg) twice daily as needed for palpitations or fast Heart Rate.   *If you need a refill on your cardiac medications before your next appointment, please call your pharmacy*  Lab Work:  None ordered.  If you have labs (blood work) drawn today and your tests are completely normal, you will receive your results only by: MyChart Message (if you have MyChart) OR A paper copy in the mail If you have any lab test that is abnormal or we need to change your treatment, we will call you to review the results.  Testing/Procedures:  Your physician has requested that you have an echocardiogram. Echocardiography is a painless test that uses sound waves to create images of your heart. It provides your doctor with information about the size and shape of your heart and how well your heart's chambers and valves are working. This procedure takes approximately one hour. There are no restrictions for this procedure. Please do NOT wear cologne, perfume or lotions (deodorant is allowed). Please arrive 15 minutes prior to your appointment time.  Follow-Up: At [facility], you and your health needs are our priority.  As part of our continuing mission to provide you with exceptional heart care, our providers are all part of one team.  This team includes your primary Cardiologist (physician) and Advanced Practice Providers or APPs (Physician Assistants and Nurse Practitioners) who all work together to provide you with the care you need, when you need it.  Your next appointment:   6 month(s)  Provider:   Shelda Bruckner, MD    We recommend signing up for the patient portal called MyChart.  Sign up information is provided on this After Visit Summary.  MyChart is used to connect with patients for Virtual Visits (Telemedicine).  Patients are able to view lab/test results, encounter notes, upcoming appointments, etc.  Non-urgent  messages can be sent to your provider as well.   To learn more about what you can do with MyChart, go to [email or web address].

## 2023-12-04 ENCOUNTER — Other Ambulatory Visit: Payer: Self-pay

## 2023-12-04 MED ORDER — METOPROLOL SUCCINATE ER 25 MG PO TB24: 12.5000 mg | ORAL_TABLET | Freq: Every day | ORAL | 3 refills | Status: AC

## 2023-12-16 DIAGNOSIS — D225 Melanocytic nevi of trunk: Secondary | ICD-10-CM | POA: Diagnosis not present

## 2023-12-16 DIAGNOSIS — L821 Other seborrheic keratosis: Secondary | ICD-10-CM | POA: Diagnosis not present

## 2023-12-16 DIAGNOSIS — D2261 Melanocytic nevi of right upper limb, including shoulder: Secondary | ICD-10-CM | POA: Diagnosis not present

## 2023-12-16 DIAGNOSIS — L438 Other lichen planus: Secondary | ICD-10-CM | POA: Diagnosis not present

## 2023-12-16 DIAGNOSIS — D1801 Hemangioma of skin and subcutaneous tissue: Secondary | ICD-10-CM | POA: Diagnosis not present

## 2023-12-16 DIAGNOSIS — D2262 Melanocytic nevi of left upper limb, including shoulder: Secondary | ICD-10-CM | POA: Diagnosis not present

## 2023-12-18 ENCOUNTER — Ambulatory Visit

## 2023-12-18 DIAGNOSIS — M8589 Other specified disorders of bone density and structure, multiple sites: Secondary | ICD-10-CM

## 2023-12-18 DIAGNOSIS — Z780 Asymptomatic menopausal state: Secondary | ICD-10-CM | POA: Diagnosis not present

## 2023-12-18 DIAGNOSIS — M85831 Other specified disorders of bone density and structure, right forearm: Secondary | ICD-10-CM | POA: Diagnosis not present

## 2023-12-18 DIAGNOSIS — M8580 Other specified disorders of bone density and structure, unspecified site: Secondary | ICD-10-CM | POA: Diagnosis not present

## 2023-12-20 DIAGNOSIS — H524 Presbyopia: Secondary | ICD-10-CM | POA: Diagnosis not present

## 2023-12-20 DIAGNOSIS — H04123 Dry eye syndrome of bilateral lacrimal glands: Secondary | ICD-10-CM | POA: Diagnosis not present

## 2023-12-20 DIAGNOSIS — E119 Type 2 diabetes mellitus without complications: Secondary | ICD-10-CM | POA: Diagnosis not present

## 2024-01-02 ENCOUNTER — Ambulatory Visit (INDEPENDENT_AMBULATORY_CARE_PROVIDER_SITE_OTHER)

## 2024-01-02 ENCOUNTER — Ambulatory Visit: Payer: Self-pay | Admitting: Nurse Practitioner

## 2024-01-02 DIAGNOSIS — E785 Hyperlipidemia, unspecified: Secondary | ICD-10-CM | POA: Diagnosis not present

## 2024-01-02 DIAGNOSIS — R011 Cardiac murmur, unspecified: Secondary | ICD-10-CM

## 2024-01-02 DIAGNOSIS — I25118 Atherosclerotic heart disease of native coronary artery with other forms of angina pectoris: Secondary | ICD-10-CM | POA: Diagnosis not present

## 2024-01-02 DIAGNOSIS — I480 Paroxysmal atrial fibrillation: Secondary | ICD-10-CM

## 2024-01-02 LAB — ECHOCARDIOGRAM COMPLETE
Area-P 1/2: 3.53 cm2
S' Lateral: 2.6 cm

## 2024-01-25 DIAGNOSIS — S0990XA Unspecified injury of head, initial encounter: Secondary | ICD-10-CM | POA: Diagnosis not present

## 2024-01-25 DIAGNOSIS — R519 Headache, unspecified: Secondary | ICD-10-CM | POA: Diagnosis not present

## 2024-01-25 DIAGNOSIS — S0011XA Contusion of right eyelid and periocular area, initial encounter: Secondary | ICD-10-CM | POA: Diagnosis not present

## 2024-01-25 DIAGNOSIS — R9082 White matter disease, unspecified: Secondary | ICD-10-CM | POA: Diagnosis not present

## 2024-01-25 DIAGNOSIS — W19XXXA Unspecified fall, initial encounter: Secondary | ICD-10-CM | POA: Diagnosis not present

## 2024-01-25 DIAGNOSIS — Z7901 Long term (current) use of anticoagulants: Secondary | ICD-10-CM | POA: Diagnosis not present

## 2024-01-25 DIAGNOSIS — G3189 Other specified degenerative diseases of nervous system: Secondary | ICD-10-CM | POA: Diagnosis not present

## 2024-01-25 DIAGNOSIS — M47812 Spondylosis without myelopathy or radiculopathy, cervical region: Secondary | ICD-10-CM | POA: Diagnosis not present

## 2024-01-25 DIAGNOSIS — I4891 Unspecified atrial fibrillation: Secondary | ICD-10-CM | POA: Diagnosis not present

## 2024-01-25 DIAGNOSIS — R079 Chest pain, unspecified: Secondary | ICD-10-CM | POA: Diagnosis not present

## 2024-01-25 DIAGNOSIS — S199XXA Unspecified injury of neck, initial encounter: Secondary | ICD-10-CM | POA: Diagnosis not present

## 2024-01-25 DIAGNOSIS — M8588 Other specified disorders of bone density and structure, other site: Secondary | ICD-10-CM | POA: Diagnosis not present

## 2024-01-25 DIAGNOSIS — S63502A Unspecified sprain of left wrist, initial encounter: Secondary | ICD-10-CM | POA: Diagnosis not present

## 2024-01-25 DIAGNOSIS — M19032 Primary osteoarthritis, left wrist: Secondary | ICD-10-CM | POA: Diagnosis not present

## 2024-02-03 DIAGNOSIS — S0011XA Contusion of right eyelid and periocular area, initial encounter: Secondary | ICD-10-CM | POA: Diagnosis not present

## 2024-02-03 DIAGNOSIS — M1612 Unilateral primary osteoarthritis, left hip: Secondary | ICD-10-CM | POA: Diagnosis not present

## 2024-02-03 DIAGNOSIS — M25552 Pain in left hip: Secondary | ICD-10-CM | POA: Diagnosis not present

## 2024-03-02 ENCOUNTER — Other Ambulatory Visit: Payer: Self-pay

## 2024-03-04 MED ORDER — ISOSORBIDE MONONITRATE ER 30 MG PO TB24: 15.0000 mg | ORAL_TABLET | Freq: Every day | ORAL | 2 refills | Status: AC

## 2024-05-08 ENCOUNTER — Other Ambulatory Visit: Payer: Self-pay

## 2024-05-08 ENCOUNTER — Other Ambulatory Visit: Payer: Self-pay | Admitting: Family Medicine

## 2024-05-08 DIAGNOSIS — R072 Precordial pain: Secondary | ICD-10-CM

## 2024-05-08 DIAGNOSIS — Z1231 Encounter for screening mammogram for malignant neoplasm of breast: Secondary | ICD-10-CM

## 2024-05-11 MED ORDER — NITROGLYCERIN 0.4 MG SL SUBL: 0.4000 mg | SUBLINGUAL_TABLET | SUBLINGUAL | 2 refills | Status: AC | PRN

## 2024-05-29 ENCOUNTER — Encounter (HOSPITAL_BASED_OUTPATIENT_CLINIC_OR_DEPARTMENT_OTHER): Payer: Self-pay | Admitting: Cardiology

## 2024-05-29 ENCOUNTER — Ambulatory Visit (HOSPITAL_BASED_OUTPATIENT_CLINIC_OR_DEPARTMENT_OTHER): Admitting: Cardiology

## 2024-05-29 VITALS — BP 102/62 | HR 80 | Ht 66.0 in | Wt 159.9 lb

## 2024-05-29 DIAGNOSIS — Z7901 Long term (current) use of anticoagulants: Secondary | ICD-10-CM

## 2024-05-29 DIAGNOSIS — D6869 Other thrombophilia: Secondary | ICD-10-CM

## 2024-05-29 DIAGNOSIS — R011 Cardiac murmur, unspecified: Secondary | ICD-10-CM | POA: Diagnosis not present

## 2024-05-29 DIAGNOSIS — E785 Hyperlipidemia, unspecified: Secondary | ICD-10-CM | POA: Diagnosis not present

## 2024-05-29 DIAGNOSIS — I471 Supraventricular tachycardia, unspecified: Secondary | ICD-10-CM | POA: Diagnosis not present

## 2024-05-29 DIAGNOSIS — I2510 Atherosclerotic heart disease of native coronary artery without angina pectoris: Secondary | ICD-10-CM | POA: Diagnosis not present

## 2024-05-29 DIAGNOSIS — I480 Paroxysmal atrial fibrillation: Secondary | ICD-10-CM | POA: Diagnosis not present

## 2024-05-29 NOTE — Patient Instructions (Signed)
 Medication Instructions:  Your physician recommends that you continue on your current medications as directed. Please refer to the Current Medication list given to you today.  *If you need a refill on your cardiac medications before your next appointment, please call your pharmacy*  Lab Work: NONE  Testing/Procedures: NONE  Follow-Up: At [facility], you and your health needs are our priority.  As part of our continuing mission to provide you with exceptional heart care, we have created designated Provider Care Teams.  These Care Teams include your primary Cardiologist (physician) and Advanced Practice Providers (APPs -  Physician Assistants and Nurse Practitioners) who all work together to provide you with the care you need, when you need it.  We recommend signing up for the patient portal called MyChart.  Sign up information is provided on this After Visit Summary.  MyChart is used to connect with patients for Virtual Visits (Telemedicine).  Patients are able to view lab/test results, encounter notes, upcoming appointments, etc.  Non-urgent messages can be sent to your provider as well.   To learn more about what you can do with MyChart, go to [email or web address].    Your next appointment:   6 month(s)  The format for your next appointment:   In Person  Provider:   Shelda Bruckner, MD or Rosaline RAMAN NP

## 2024-05-29 NOTE — Progress Notes (Signed)
 " Cardiology Office Note:  .   Date:  05/29/2024  ID:  Alison Fields, DOB 01/15/1941, MRN [id number] PCP: Teresa Channel, MD  [facility] Providers Cardiologist:  Shelda Bruckner, MD {  History of Present Illness: .   Alison Fields is a 84 y.o. female with PMH paroxysmal atrial fibrillation, paroxysmal SVT, CAD, hyperlipidemia. She was previously followed by Dr. Hobart and established care with me on 05/29/24.  Pertinent CV history: Coronary CT 06/2022 with Ca score of 235 (64th %ile), likely CTO of distal LAD with microchannel vs. Collateral flow. Has been managed medically.   Today: Had a fall while in [place]  in August, took several months to recover from her fall. Just recently feels like she is back to her baseline. Back to walking daily.   No bleeding issues on apixaban.  Has only used extra PRN metoprolol  once, was several months ago. 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   ROS: Denies chest pain, shortness of breath at rest or with normal exertion. No PND, orthopnea, LE edema or unexpected weight gain. No syncope. ROS otherwise negative except as noted.   Studies Reviewed: SABRA    EKG:  EKG Interpretation Date/Time:  Friday May 29 2024 09:50:30 EST Ventricular Rate:  80 PR Interval:  190 QRS Duration:  86 QT Interval:  384 QTC Calculation: 442 R Axis:   69  Text Interpretation: Normal sinus rhythm Nonspecific T wave abnormality When compared with ECG of 24-Jun-2023 10:20, No significant change was found Confirmed by Bruckner Shelda [phone]) on 05/29/2024 10:14:33 AM    Physical Exam:   VS:  BP 102/62 (BP Location: Right Arm, Patient Position: Sitting, Cuff Size: Normal)   Pulse 80   Ht 5' 6 (1.676 m)   Wt 159 lb 14.4 oz (72.5 kg)   SpO2 95%   BMI 25.81 kg/m    Wt Readings from Last 3 Encounters:  05/29/24 159 lb 14.4 oz (72.5 kg)  11/27/23 154 lb (69.9 kg)  06/24/23 155 lb 6.4 oz (70.5 kg)    GEN: Well nourished, well developed in no  acute distress HEENT: Normal, moist mucous membranes NECK: No JVD CARDIAC: regular rhythm, normal S1 and S2, no rubs or gallops. 1/6 systolic murmur. VASCULAR: Radial and DP pulses 2+ bilaterally. No carotid bruits RESPIRATORY:  Clear to auscultation without rales, wheezing or rhonchi  ABDOMEN: Soft, non-tender, non-distended MUSCULOSKELETAL:  Ambulates independently SKIN: Warm and dry, no edema NEUROLOGIC:  Alert and oriented x 3. No focal neuro deficits noted. PSYCHIATRIC:  Normal affect    ASSESSMENT AND PLAN: .    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 No bleeding. 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 Limit salt. -recommend moderate walking, 3-5 times/week for 30-50 minutes each session. Aim for at least 150 minutes/week. Goal should be pace of 3 miles/hours, or walking 1.5 miles in 30 minutes -recommend avoidance of tobacco  products. Avoid excess alcohol.  Dispo: 6 mos or sooner as needed  Signed, Shelda Bruckner, MD   Shelda Bruckner, MD, PhD, [facility]  [facility]    Heart & Vascular at [facility] at [facility] [address], [address] [place], [place]  [address] [phone]   "

## 2024-07-02 ENCOUNTER — Other Ambulatory Visit: Payer: Self-pay | Admitting: Medical Genetics

## 2024-07-08 ENCOUNTER — Other Ambulatory Visit (HOSPITAL_COMMUNITY)

## 2024-07-09 ENCOUNTER — Ambulatory Visit
